# Patient Record
Sex: Female | Born: 1940 | Race: White | Hispanic: No | Marital: Married | Smoking: Never smoker
Health system: Southern US, Community
[De-identification: ages and names within clinical notes are randomized; demographics above are authoritative.]

## PROBLEM LIST (undated history)

## (undated) DIAGNOSIS — K219 Gastro-esophageal reflux disease without esophagitis: Secondary | ICD-10-CM

## (undated) DIAGNOSIS — R0602 Shortness of breath: Secondary | ICD-10-CM

## (undated) DIAGNOSIS — R011 Cardiac murmur, unspecified: Secondary | ICD-10-CM

## (undated) DIAGNOSIS — J984 Other disorders of lung: Secondary | ICD-10-CM

## (undated) DIAGNOSIS — K649 Unspecified hemorrhoids: Secondary | ICD-10-CM

## (undated) DIAGNOSIS — R58 Hemorrhage, not elsewhere classified: Secondary | ICD-10-CM

## (undated) DIAGNOSIS — I10 Essential (primary) hypertension: Secondary | ICD-10-CM

## (undated) DIAGNOSIS — K449 Diaphragmatic hernia without obstruction or gangrene: Secondary | ICD-10-CM

## (undated) DIAGNOSIS — R51 Headache: Secondary | ICD-10-CM

## (undated) DIAGNOSIS — G629 Polyneuropathy, unspecified: Secondary | ICD-10-CM

## (undated) DIAGNOSIS — M1990 Unspecified osteoarthritis, unspecified site: Secondary | ICD-10-CM

## (undated) DIAGNOSIS — E079 Disorder of thyroid, unspecified: Secondary | ICD-10-CM

## (undated) DIAGNOSIS — C801 Malignant (primary) neoplasm, unspecified: Secondary | ICD-10-CM

## (undated) DIAGNOSIS — R519 Headache, unspecified: Secondary | ICD-10-CM

## (undated) DIAGNOSIS — Z9981 Dependence on supplemental oxygen: Secondary | ICD-10-CM

## (undated) DIAGNOSIS — E039 Hypothyroidism, unspecified: Secondary | ICD-10-CM

## (undated) DIAGNOSIS — Z889 Allergy status to unspecified drugs, medicaments and biological substances status: Secondary | ICD-10-CM

## (undated) HISTORY — PX: EYE SURGERY: SHX253

## (undated) HISTORY — DX: Essential (primary) hypertension: I10

## (undated) HISTORY — DX: Other disorders of lung: J98.4

## (undated) HISTORY — PX: CHOLECYSTECTOMY: SHX55

## (undated) HISTORY — PX: APPENDECTOMY: SHX54

## (undated) HISTORY — DX: Diaphragmatic hernia without obstruction or gangrene: K44.9

## (undated) HISTORY — PX: NOSE SURGERY: SHX723

## (undated) HISTORY — DX: Gastro-esophageal reflux disease without esophagitis: K21.9

## (undated) HISTORY — PX: OTHER SURGICAL HISTORY: SHX169

## (undated) HISTORY — DX: Disorder of thyroid, unspecified: E07.9

## (undated) HISTORY — DX: Allergy status to unspecified drugs, medicaments and biological substances: Z88.9

## (undated) HISTORY — PX: ABDOMINAL HYSTERECTOMY: SHX81

---

## 1999-07-06 ENCOUNTER — Encounter: Payer: Self-pay | Admitting: Orthopedic Surgery

## 1999-07-06 ENCOUNTER — Encounter: Admission: RE | Admit: 1999-07-06 | Discharge: 1999-07-06 | Payer: Self-pay | Admitting: Orthopedic Surgery

## 2004-05-11 ENCOUNTER — Ambulatory Visit (HOSPITAL_COMMUNITY): Admission: RE | Admit: 2004-05-11 | Discharge: 2004-05-12 | Payer: Self-pay | Admitting: Orthopaedic Surgery

## 2009-02-25 ENCOUNTER — Encounter: Payer: Self-pay | Admitting: Internal Medicine

## 2009-03-26 ENCOUNTER — Encounter: Payer: Self-pay | Admitting: Internal Medicine

## 2009-07-30 ENCOUNTER — Encounter: Payer: Self-pay | Admitting: Internal Medicine

## 2009-08-28 ENCOUNTER — Encounter: Payer: Self-pay | Admitting: Internal Medicine

## 2009-08-29 ENCOUNTER — Ambulatory Visit: Payer: Self-pay | Admitting: Internal Medicine

## 2009-08-29 DIAGNOSIS — R0602 Shortness of breath: Secondary | ICD-10-CM

## 2009-09-03 ENCOUNTER — Telehealth: Payer: Self-pay | Admitting: Internal Medicine

## 2009-09-10 ENCOUNTER — Encounter: Payer: Self-pay | Admitting: Internal Medicine

## 2009-09-22 ENCOUNTER — Ambulatory Visit (HOSPITAL_COMMUNITY): Admission: RE | Admit: 2009-09-22 | Discharge: 2009-09-22 | Payer: Self-pay | Admitting: Internal Medicine

## 2009-09-22 ENCOUNTER — Ambulatory Visit: Payer: Self-pay | Admitting: Internal Medicine

## 2009-10-21 ENCOUNTER — Ambulatory Visit: Payer: Self-pay | Admitting: Internal Medicine

## 2009-11-04 ENCOUNTER — Encounter: Payer: Self-pay | Admitting: Internal Medicine

## 2009-11-25 ENCOUNTER — Ambulatory Visit: Payer: Self-pay | Admitting: Internal Medicine

## 2009-12-25 ENCOUNTER — Encounter: Payer: Self-pay | Admitting: Internal Medicine

## 2010-01-28 ENCOUNTER — Encounter: Payer: Self-pay | Admitting: Internal Medicine

## 2010-01-28 ENCOUNTER — Ambulatory Visit
Admission: RE | Admit: 2010-01-28 | Discharge: 2010-01-28 | Payer: Self-pay | Source: Home / Self Care | Attending: Internal Medicine | Admitting: Internal Medicine

## 2010-02-05 ENCOUNTER — Ambulatory Visit (HOSPITAL_COMMUNITY)
Admission: RE | Admit: 2010-02-05 | Discharge: 2010-02-05 | Payer: Self-pay | Source: Home / Self Care | Attending: Internal Medicine | Admitting: Internal Medicine

## 2010-02-05 ENCOUNTER — Ambulatory Visit: Admission: RE | Admit: 2010-02-05 | Discharge: 2010-02-05 | Payer: Self-pay | Source: Home / Self Care

## 2010-02-08 LAB — CONVERTED CEMR LAB
BUN: 14 mg/dL (ref 6–23)
CO2: 27 meq/L (ref 19–32)
Calcium: 8.8 mg/dL (ref 8.4–10.5)
Chloride: 102 meq/L (ref 96–112)
Creatinine, Ser: 1.3 mg/dL — ABNORMAL HIGH (ref 0.4–1.2)
GFR calc non Af Amer: 44.08 mL/min (ref >60)
Glucose, Bld: 79 mg/dL (ref 70–99)
Potassium: 4.7 meq/L (ref 3.5–5.1)
Pro B Natriuretic peptide (BNP): 52.5 pg/mL (ref 0.0–100.0)
Sodium: 136 meq/L (ref 135–145)

## 2010-02-10 NOTE — Miscellaneous (Signed)
  Clinical Lists Changes  Observations: Added new observation of SOCIAL HX: Tobacco Use - No.  Alcohol Use - no  (08/28/2009 10:43) Added new observation of ALCOHOL COMM: no (08/28/2009 10:43) Added new observation of SMOK STATUS: never (08/28/2009 10:43) Added new observation of FAMILY HX: cancer DM (08/28/2009 10:43) Added new observation of PAST SURG HX: Cholecystectomy Appendectomy Hypertension Dyspnea (08/28/2009 10:43) Added new observation of ECHOINTERP: Technically difficult study. Normal LV systolic function . Borderline LVH Mild mitral regurgitation . Mild pulmonary hypertension. (03/26/2009 10:45)      Echocardiogram  Procedure date:  03/26/2009  Findings:      Technically difficult study. Normal LV systolic function . Borderline LVH Mild mitral regurgitation . Mild pulmonary hypertension.   Past History:  Past Surgical History: Cholecystectomy Appendectomy Hypertension Dyspnea   Family History: cancer DM  Social History: Tobacco Use - No.  Alcohol Use - no Smoking Status:  never

## 2010-02-10 NOTE — Letter (Signed)
Summary: [facility]  [facility]   Imported By: Marylou Mccoy 09/22/2009 09:39:18  _____________________________________________________________________  External Attachment:    Type:   Image     Comment:   External Document

## 2010-02-10 NOTE — Letter (Signed)
Summary: [facility]  [facility]   Imported By: Marylou Mccoy 09/22/2009 10:08:06  _____________________________________________________________________  External Attachment:    Type:   Image     Comment:   External Document

## 2010-02-10 NOTE — Assessment & Plan Note (Signed)
Summary: np6/Dyspnea/appt at 10:00/ gd   Visit Type:  New Pt Referring Provider:  Marcellus Scott Primary Provider:  Dr. Veatrice Kells  CC:  no complatins.  History of Present Illness: 70 is a year old woman with HTN, HL and allergies referred by Dr. Blenda Nicely for further evlauation of dyspnea and possible RHC.  Denies any h/o known heart disease. Has never had a cath. Had stress test several months ago which reportedly showed poor exercise tolerance but apparently otherwise normal. Echo on 03/26/2009 showed normal LV function. Borderline LVH. Reported normal diastolic filling pattern (?) Mild MR. Otherwise valves normal.   PFDs showed FVC 55% FEV1 1.47 (60%) FEV1/FVC 110%  DLCO 55%. No comment on RV. RVSP est 39. Diagnosed with asthma.   Says she has been SOB for many years. Says she can walk to end of her yard and then she is very SOB. Has to stop and take a break. No CP. When she goes to [facility] can walk a few aisles before she has to stop and rest. Feels it is chronic; no change. No orthopnea, PND. Feels her feet and ankles swell at times. Never a smoker. Previous worked at [employer] in her 26s. Just a few years. 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   Husband denies her snoring. Has a lot of headaches. Had sleep study which was ok. Doesn't exercise at all. Occasional wheezing. Frequent cough. No rashes. + arthitis.   I walked her in the office. Was able to walk quickly sats stayed in 95-97% Good HR response to exercise.   Problems Prior to Update: 1)  Shortness of Breath  (ICD-786.05)  Medications Prior to Update: 1)  None  Current Medications (verified): 1)  Lisinopril 40 Mg Tabs (Lisinopril) .... Take 1 Tablet Every Day 2)  Synthroid 88 Mcg Tabs (Levothyroxine Sodium) .... Take 1 Tablet Every Day 3)  Claritin-D 24 Hour 10-240 Mg Xr24h-Tab (Loratadine-Pseudoephedrine) .... Once Daily 4)  Simvastatin 40 Mg Tabs (Simvastatin) .... Take One Tablet By Mouth Daily At  Bedtime 5)  Aspirin 81 Mg Tbec (Aspirin) .... Take One Tablet By Mouth Daily 6)  Theratrum Complete  Tabs (Multiple Vitamins-Minerals) .... Once Daily 7)  Prilosec 20 Mg Cpdr (Omeprazole) .... Two Times A Day 8)  Vesicare 5 Mg Tabs (Solifenacin Succinate) .... Once Daily 9)  Propranolol Hcl 40 Mg/15ml Soln (Propranolol Hcl) .... Two Times A Day 10)  B-1 Vitamin .... Once Daily 11)  Premarin 0.3 Mg Tabs (Estrogens Conjugated) .... Once Daily 12)  Qvar 40 Mcg/act Aers (Beclomethasone Dipropionate) .... 2 Puffs Daily 13)  Allergy Shot .... Once A Year  Allergies (verified): 1)  ! Ampicillin 2)  ! Zithromax  Past History:  Past Medical History: 1) Asthma/restrictive lung disease 2) HTN 3) HL 4) Thyroid disease with goiter 5) GERD/hiatal hernia 6) Allergies  Family History: Reviewed history from 08/28/2009 and no changes required. cancer DM  Social History: Reviewed history from 08/28/2009 and no changes required. Married. Tobacco Use - No.  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   Vital Signs:  Patient profile:   70 year old female Height:      56.5 inches Weight:      186 pounds BMI:     41.11 Pulse rate:   50 / minute BP sitting:   120 / 70  (left arm)  Cuff size:   regular  Vitals Entered By: Caralee Ates CMA (August 29, 2009 10:19 AM)  Physical Exam  General:  Gen: well appearing. no resp difficulty HEENT: normal Neck: supple. no JVD. Carotids 2+ bilat; no bruits. small goiter.  No lymphadenopathy or thryomegaly appreciated. Cor: PMI nondisplaced. Regular rate & rhythm. No rubs, gallops, murmur. Lungs: clear Abdomen: soft, nontender, nondistended. No hepatosplenomegaly. No bruits or masses. Good bowel sounds. Extremities: no cyanosis, clubbing, rash, 1+ edema Neuro: alert & orientedx3, cranial nerves grossly intact. moves all 4 extremities w/o difficulty.  affect pleasant    Impression & Recommendations:  Problem # 1:  SHORTNESS OF BREATH (ICD-786.05) Likley multifactorial. Has evidence of some restriciton on her PFTs (versus poor effort) and also some mild edmea which may be related to diastolic dysfunction. I also suspect deconditioning playing a signficant role. Will plan cardiopulmonary exercise test to further evlauate. also start low-dose diuretic (lasix 20 mg on Mon and Fri with Kcl 20) to see if this helps. She will enroll in pulmonary rehab with Dr. Blenda Nicely. If symptoms persist in 4-8 weeks would plan R and L heart cath (+/- chest CT to more fully evaluate). Extensive discussion ( ~30+ minutes with patient and her husband about my thoughts on her dyspnea).   Other Orders: CPX Test at [facility] (CPX Test)  Patient Instructions: 1)  Your physician recommends that you schedule a follow-up appointment in: 4-6 WEEKS 2)  Your physician recommends that you return for lab work in:2 WEEKS IN [place] 3)  Your physician has recommended you make the following change in your medication: START FUROSEMIDE 20MG  ON MONDAY AND FRIDAY ONLY 4)  KLOR-CON M20 ON MONDAY AND FRIDAY ONLY 5)  Your physician has recommended that you have a cardiopulmonary stress test (CPX).  CPX testing is a non-invasive measurement of heart and lung function. It replaces a traditional treadmill stress test. This type of test provides a tremendous amount of information that relates not only to your present condition but also for future outcomes.  This test combines measurements of your ventilation, respiratory gas exchange in the lungs, electrocardiogram (EKG), blood pressure and physical response before, during, and following an exercise protocol. Prescriptions: KLOR-CON M20 20 MEQ CR-TABS (POTASSIUM CHLORIDE CRYS CR) one tablet every monday and friday only  #30 x 12   Entered by:   Deliah Goody, RN   Authorized by:   Dolores Patty, MD, [facility]   Signed by:    Deliah Goody, RN on 08/29/2009   Method used:   Electronically to        [facility] (retail)       [address]       [place], [place]  [address]       Ph: [phone] or [phone]       Fax: [phone]   RxID:   [id number] FUROSEMIDE 20 MG TABS (FUROSEMIDE) Take one tablet by mouth monday and friday only  #30 x 12   Entered by:   Deliah Goody, RN   Authorized by:   Dolores Patty, MD, [facility]   Signed by:   Deliah Goody, RN on 08/29/2009   Method used:   Electronically to        [facility] (retail)       [address]       [place]  [place], [place]  [address]       Ph: [phone] or [phone]       Fax: [phone]   RxID:   [id number]

## 2010-02-10 NOTE — Progress Notes (Signed)
Summary: pt has questions  Phone Note Call from Patient   Caller: Patient [phone] Reason for Call: Talk to Nurse Summary of Call: pt needs to ask question re last visit pls call [phone] Initial call taken by: Glynda Jaeger,  September 03, 2009 3:29 PM  Follow-up for Phone Call        spoke w/pt she will call dr Pricilla Handler office to arrange pulm rehab Meredith Staggers, RN  September 03, 2009 5:38 PM

## 2010-02-10 NOTE — Assessment & Plan Note (Signed)
Summary: per check out/per pt need this time/saf   Visit Type:  Follow-up Referring Provider:  Marcellus Scott Primary Provider:  Dr. Veatrice Kells  CC:  shortness of breath.  History of Present Illness: Rachel Green is a 70 year old woman (non-smoker) with HTN, HL and allergies referred by Dr. Blenda Nicely recently for further evlauation of dyspnea and possible RHC.  Denies any h/o known heart disease. Has never had a cath. Had stress test several months ago which reportedly showed poor exercise tolerance but apparently otherwise normal. Echo on 03/26/2009 showed normal LV function. Borderline LVH. Reported normal diastolic filling pattern (?) Mild MR. Otherwise valves normal.   PFDs showed FVC 55% FEV1 1.47 (60%) FEV1/FVC 110%  DLCO 55%. No comment on RV. RVSP est 39. Diagnosed with asthma.   At last visit we started her on low-dose lasix and referred for CPX.  CPX showed pVO2 13.9 (77%) with slope 42 RER 1.13  HR response blunted with peak HR 86  Ve/MVV 62%. No desats with exercise. Resting spiro FEV1 1.5 (58%) FVC 1.9 (57%) FEV1/FVC 78%. Felt to be mild to moderate defect due to restrictive lung disease, chrontropic incompetence and possibly diastolic dysfunction.  Feels that the lasix has helped her swelling and breathing. Has enrolled in pulmonary rehab and feels like she is getting stronger and breathing better. Continues with mild edema. Urine output much increased. No orthopnea or PND.   Current Medications (verified): 1)  Lisinopril 40 Mg Tabs (Lisinopril) .... Take 1 Tablet Every Day 2)  Synthroid 88 Mcg Tabs (Levothyroxine Sodium) .... Take 1 Tablet Every Day 3)  Claritin-D 24 Hour 10-240 Mg Xr24h-Tab (Loratadine-Pseudoephedrine) .... Once Daily 4)  Simvastatin 40 Mg Tabs (Simvastatin) .... Take One Tablet By Mouth Daily At Bedtime 5)  Aspirin 81 Mg Tbec (Aspirin) .... Take One Tablet By Mouth Daily 6)  Theratrum Complete  Tabs (Multiple Vitamins-Minerals) .... Once Daily 7)  Prilosec 20 Mg Cpdr  (Omeprazole) .... Two Times A Day 8)  Vesicare 5 Mg Tabs (Solifenacin Succinate) .... Once Daily 9)  Propranolol Hcl 40 Mg/68ml Soln (Propranolol Hcl) .... Two Times A Day 10)  B-1 Vitamin .... Once Daily 11)  Premarin 0.3 Mg Tabs (Estrogens Conjugated) .... Once Daily 12)  Qvar 40 Mcg/act Aers (Beclomethasone Dipropionate) .... 2 Puffs Daily 13)  Allergy Shot .... Once A Year 14)  Furosemide 20 Mg Tabs (Furosemide) .... Take One Tablet By Mouth Monday and Friday Only 15)  Klor-Con M20 20 Meq Cr-Tabs (Potassium Chloride Crys Cr) .... One Tablet Every Monday and Friday Only  Allergies (verified): 1)  ! Ampicillin 2)  ! Zithromax  Past History:  Past Medical History: Last updated: 08/29/2009 1) Asthma/restrictive lung disease 2) HTN 3) HL 4) Thyroid disease with goiter 5) GERD/hiatal hernia 6) Allergies  Review of Systems       As per HPI and past medical history; otherwise all systems negative.   Vital Signs:  Patient profile:   70 year old female Height:      56.5 inches Weight:      188 pounds BMI:     41.56 Pulse rate:   65 / minute BP sitting:   116 / 68  (left arm) Cuff size:   regular  Vitals Entered By: Hardin Negus, RMA (October 21, 2009 9:05 AM)  Physical Exam  General:  Well appearing. no resp difficulty HEENT: normal Neck: supple. no JVD. Carotids 2+ bilat; no bruits. small goiter.  No lymphadenopathy or thryomegaly appreciated. Cor: PMI nondisplaced. Regular  rate & rhythm. No rubs, gallops, murmur. Lungs: clear Abdomen: soft, nontender, nondistended. No hepatosplenomegaly. No bruits or masses. Good bowel sounds. Extremities: no cyanosis, clubbing, rash, 1+ edema Neuro: alert & orientedx3, cranial nerves grossly intact. moves all 4 extremities w/o difficulty. affect pleasant    Impression & Recommendations:  Problem # 1:  SHORTNESS OF BREATH (ICD-786.05) Appears to be multifactorial though is improved with diuresis and pulmonary rehab. Will  increase lasix to 20 mg once daily. Continue pulmonary rehab. Given chronotropic incompetence have asked her to cut her propoanolol (she takes for tremors) in half (1/2 tab two times a day) and see if this helps. Will see her back in 4-6 weeks and see how she is doing. Check BMET in 2 weeks to make sure potassium/renal function are stable with lasix.   Patient Instructions: 1)  Your physician has recommended you make the following change in your medication: Take Furosemide and Potassium every day and decrease Propanolol to 20mg  daily 2)  Your physician recommends that you return for lab work in: 2 weeks (bmet 786.05) 3)  Follow up in 4-6 weeks Prescriptions: KLOR-CON M20 20 MEQ CR-TABS (POTASSIUM CHLORIDE CRYS CR) one tablet once daily  #90 x 3   Entered by:   Meredith Staggers, RN   Authorized by:   Dolores Patty, MD, [facility]   Signed by:   Meredith Staggers, RN on 10/21/2009   Method used:   Electronically to        [facility] (retail)       [address]       [place], [place]  [address]       Ph: [phone] or [phone]       Fax: [phone]   RxID:   [id number] FUROSEMIDE 20 MG TABS (FUROSEMIDE) Take one tablet by once daily  #90 x 3   Entered by:   Meredith Staggers, RN   Authorized by:   Dolores Patty, MD, [facility]   Signed by:   Meredith Staggers, RN on 10/21/2009   Method used:   Electronically to        [facility] (retail)       [address]       [place], [place]  [address]       Ph: [phone] or [phone]       Fax: [phone]   RxID:   [id number] PROPRANOLOL HCL 20 MG TABS (PROPRANOLOL HCL) Take 1 tablet by mouth once a day  #90 x 3   Entered by:   Meredith Staggers, RN   Authorized by:   Dolores Patty, MD, [facility]   Signed by:   Meredith Staggers, RN on 10/21/2009   Method used:   Electronically to        [facility] (retail)       [address]       [place], [place]  [address]       Ph: [phone] or [phone]       Fax: [phone]   RxID:   [id number] KLOR-CON M20 20 MEQ CR-TABS (POTASSIUM CHLORIDE CRYS CR) one tablet once daily  #30 x 6   Entered by:   Meredith Staggers, RN   Authorized by:   Dolores Patty, MD, [facility]   Signed by:  Meredith Staggers, RN on 10/21/2009   Method used:   Electronically to        [facility] (retail)       [address]       [place], [place]  [address]       Ph: [phone] or [phone]       Fax: [phone]   RxID:   [id number] FUROSEMIDE 20 MG TABS (FUROSEMIDE) Take one tablet by once daily  #30 x 6   Entered by:   Meredith Staggers, RN   Authorized by:   Dolores Patty, MD, [facility]   Signed by:   Meredith Staggers, RN on 10/21/2009   Method used:   Electronically to        [facility] (retail)       [address]       [place], [place]  [address]       Ph: [phone] or [phone]       Fax: [phone]   RxID:   [id number]

## 2010-02-10 NOTE — Letter (Signed)
Summary: [facility]  [facility]   Imported By: Marylou Mccoy 09/22/2009 09:32:59  _____________________________________________________________________  External Attachment:    Type:   Image     Comment:   External Document

## 2010-02-10 NOTE — Assessment & Plan Note (Signed)
Summary: PER CHECK OUT/SF   Visit Type:  Follow-up Referring Provider:  Marcellus Scott Primary Provider:  Dr. Veatrice Kells  CC:  hiatal hernia - no cardiac .  History of Present Illness: Rachel Green is a 70 year old woman (non-smoker) with HTN, HL and allergies referred by Dr. Blenda Nicely recently for further evlauation of dyspnea and possible RHC.  Denies any h/o known heart disease. Has never had a cath. Had stress test several months ago which reportedly showed poor exercise tolerance but apparently otherwise normal. Echo on 03/26/2009 showed normal LV function. Borderline LVH. Reported normal diastolic filling pattern (?) Mild MR. Otherwise valves normal.   PFDs showed FVC 55% FEV1 1.47 (60%) FEV1/FVC 110%  DLCO 55%. No comment on RV. RVSP est 39. Diagnosed with asthma.    CPX showed pVO2 13.9 (77%) with slope 42 RER 1.13  HR response blunted with peak HR 86  Ve/MVV 62%. No desats with exercise. Resting spiro FEV1 1.5 (58%) FVC 1.9 (57%) FEV1/FVC 78%. Felt to be mild to moderate defect due to restrictive lung disease, chrontropic incompetence and possibly diastolic dysfunction.  At last visit lasix increased to daily and she was referred to pulmonary rehab. Beginning to feel much stronger. Breathing better. No CP. No edema ("I can see my ankle bone now."). No orthopnea or PND. Labs from 10/15 showed normal renal function and normal potassium after increasing lasix.  Pulm [facility] classes will run out after one more visit and renew in Jan 2012  Current Medications (verified): 1)  Lisinopril 40 Mg Tabs (Lisinopril) .... Take 1 Tablet Every Day 2)  Synthroid 88 Mcg Tabs (Levothyroxine Sodium) .... Take 1 Tablet Every Day 3)  Claritin-D 24 Hour 10-240 Mg Xr24h-Tab (Loratadine-Pseudoephedrine) .... Once Daily 4)  Simvastatin 40 Mg Tabs (Simvastatin) .... Take One Tablet By Mouth Daily At Bedtime 5)  Aspirin 81 Mg Tbec (Aspirin) .... Take One Tablet By Mouth Daily 6)  Theratrum Complete  Tabs (Multiple  Vitamins-Minerals) .... Once Daily 7)  Prilosec 20 Mg Cpdr (Omeprazole) .... Two Times A Day 8)  Vesicare 5 Mg Tabs (Solifenacin Succinate) .... Once Daily 9)  Propranolol Hcl 20 Mg Tabs (Propranolol Hcl) .... Take 1 Tablet By Mouth Once A Day 10)  B-1 Vitamin .... Once Daily 11)  Premarin 0.3 Mg Tabs (Estrogens Conjugated) .... Once Daily 12)  Symbicort 80-4.5 Mcg/act Aero (Budesonide-Formoterol Fumarate) .... Two Times A Day 13)  Allergy Shot .... Once A Week 14)  Furosemide 20 Mg Tabs (Furosemide) .... Take One Tablet By Once Daily 15)  Klor-Con M20 20 Meq Cr-Tabs (Potassium Chloride Crys Cr) .... One Tablet Once Daily  Allergies (verified): 1)  ! Ampicillin 2)  ! Zithromax  Review of Systems       As per HPI and past medical history; otherwise all systems negative.   Vital Signs:  Patient profile:   70 year old female Height:      56.5 inches Weight:      187 pounds BMI:     41.33 Pulse rate:   76 / minute Pulse (ortho):   57 / minute BP sitting:   88 / 60  (left arm) BP standing:   114 / 66 Cuff size:   regular  Vitals Entered By: Hardin Negus, RMA (November 25, 2009 9:18 AM)  Serial Vital Signs/Assessments:  Time      Position  BP       Pulse  Resp  Temp     By  Lying RA  112/64   51                    Hardin Negus, [place]           Sitting   109/65   [address] [place]           Standing  114/66   57                    Hardin Negus, [place]  Comments: 2 mins   BP 120/67  P 55 5 mins   BP 113/64  P 55 no symptoms By: Hardin Negus, RMA    Physical Exam  General:  Well appearing. no resp difficulty HEENT: normal Neck: supple. no JVD. Carotids 2+ bilat; no bruits. small goiter.  No lymphadenopathy or thryomegaly appreciated. Cor: PMI nondisplaced. Regular rate & rhythm. No rubs, gallops, murmur. Lungs: clear Abdomen: soft, nontender, nondistended. No hepatosplenomegaly. No bruits or masses. Good bowel sounds. Extremities: no  cyanosis, clubbing, rash, 1+ edema Neuro: alert & orientedx3, cranial nerves grossly intact. moves all 4 extremities w/o difficulty. affect pleasant    Impression & Recommendations:  Problem # 1:  SHORTNESS OF BREATH (ICD-786.05) Much improved with pulmonary rehab and diuresis. However, BP low. Will check orthostatics, may have to cut lasix back. Stressed to her the need to continue her exercise program at home when pulmonary rehab runs out. Check labs today. As long as symptoms improving will not pursue cath at this time, if gets worse will reconsider. Will likely need f/u exercise testing early next year to reassess functional capacity.  Other Orders: EKG w/ Interpretation (93000) TLB-BMP (Basic Metabolic Panel-BMET) (80048-METABOL) TLB-BNP (B-Natriuretic Peptide) (83880-BNPR)  Patient Instructions: 1)  Follow up in 2 months. 2)  Labs today

## 2010-02-12 NOTE — Assessment & Plan Note (Signed)
Summary: 2 month rov/sl   Visit Type:  2 months follow up Referring Provider:  Marcellus Scott Primary Provider:  Dr. Veatrice Kells  CC:  Heart flutter.  History of Present Illness: Rachel Green is a 70 year old woman (non-smoker) with HTN, HL and allergies referred by Dr. Blenda Nicely recently for further evaluation of dyspnea.  Denies any h/o known heart disease. Has never had a cath. Had stress test several months ago which reportedly showed poor exercise tolerance but apparently otherwise normal. Echo on 03/26/2009 showed normal LV function. Borderline LVH. Reported normal diastolic filling pattern (?) Mild MR. Otherwise valves normal.   PFDs showed FVC 55% FEV1 1.47 (60%) FEV1/FVC 110%  DLCO 55%. No comment on RV. RVSP est 39. Diagnosed with asthma.   CPX showed pVO2 13.9 (77%) with slope 42 RER 1.13  HR response blunted with peak HR 86  Ve/MVV 62%. No desats with exercise. Resting spiro FEV1 1.5 (58%) FVC 1.9 (57%) FEV1/FVC 78%. Felt to be mild to moderate defect due to restrictive lung disease, chrontropic incompetence and possibly diastolic dysfunction.  Finished pulmonary rehab in November. Continues to feel OK. Makes an effort to walk around her house as much as possible. Occasionally goes out for 15-20 min walk. Get SOB with walking up stpes or hills. Able to do all ADLs without dyspnea (which is refreshing for her). Edema well managed with lasix. Recent BNP 53.   Current Medications (verified): 1)  Lisinopril 40 Mg Tabs (Lisinopril) .... Take 1 Tablet Every Day 2)  Synthroid 88 Mcg Tabs (Levothyroxine Sodium) .... Take 1 Tablet Every Day 3)  Claritin-D 24 Hour 10-240 Mg Xr24h-Tab (Loratadine-Pseudoephedrine) .... Once Daily 4)  Simvastatin 40 Mg Tabs (Simvastatin) .... Take One Tablet By Mouth Daily At Bedtime 5)  Aspirin 81 Mg Tbec (Aspirin) .... Take One Tablet By Mouth Daily 6)  Theratrum Complete  Tabs (Multiple Vitamins-Minerals) .... Once Daily 7)  Prilosec 20 Mg Cpdr (Omeprazole) .... Two  Times A Day 8)  Vesicare 5 Mg Tabs (Solifenacin Succinate) .... Once Daily 9)  Propranolol Hcl 20 Mg Tabs (Propranolol Hcl) .... Take 1 Tablet By Mouth Once A Day 10)  B-1 Vitamin .... Once Daily 11)  Premarin 0.3 Mg Tabs (Estrogens Conjugated) .... Once Daily 12)  Symbicort 80-4.5 Mcg/act Aero (Budesonide-Formoterol Fumarate) .... Two Times A Day 13)  Allergy Shot .... Once A Week 14)  Furosemide 20 Mg Tabs (Furosemide) .... Take One Tablet By Once Daily 15)  Klor-Con M20 20 Meq Cr-Tabs (Potassium Chloride Crys Cr) .... One Tablet Once Daily  Allergies: 1)  ! Ampicillin 2)  ! Zithromax  Review of Systems       As per HPI and past medical history; otherwise all systems negative.   Vital Signs:  Patient profile:   70 year old female Height:      56.5 inches Weight:      183 pounds BMI:     40.45 Pulse rate:   61 / minute Pulse rhythm:   regular Resp:     18 per minute BP sitting:   116 / 62  (left arm)  Vitals Entered By: Vikki Ports (January 28, 2010 11:35 AM)  Physical Exam  General:  Well appearing. no resp difficulty HEENT: normal Neck: supple. no JVD. Carotids 2+ bilat; no bruits. small goiter.  No lymphadenopathy or thryomegaly appreciated. Cor: PMI nondisplaced. Regular rate & rhythm. No rubs, gallops, murmur. Lungs: clear Abdomen: soft, nontender, nondistended. No hepatosplenomegaly. No bruits or masses. Good bowel sounds. Extremities:  no cyanosis, clubbing, rash, 1+ edema Neuro: alert & orientedx3, cranial nerves grossly intact. moves all 4 extremities w/o difficulty. affect pleasant    Impression & Recommendations:  Problem # 1:  SHORTNESS OF BREATH (ICD-786.05) Doing well. Overall, I fell dyspnea is multifactorial with a large component of deconditing + asthma/bronchitis and mild diastolic dysfunction.  I encouraged her to remaan active to sustain the benefits she acheived with pulmonary rehab. Will also continue lasix. Will get f/u echo now. See me back in  6 months - sooner if dyspnea progressing.   Other Orders: Echocardiogram (Echo)  Patient Instructions: 1)  Your physician recommends that you schedule a follow-up appointment in: 6 months 2)  Your physician recommends that you continue on your current medications as directed. Please refer to the Current Medication list given to you today. 3)  Your physician has requested that you have an echocardiogram.  Echocardiography is a painless test that uses sound waves to create images of your heart. It provides your doctor with information about the size and shape of your heart and how well your heart's chambers and valves are working.  This procedure takes approximately one hour. There are no restrictions for this procedure.

## 2010-02-18 ENCOUNTER — Telehealth: Payer: Self-pay | Admitting: Internal Medicine

## 2010-02-26 NOTE — Progress Notes (Signed)
Summary: pt rtn call***pt rtn call 2nd time  Phone Note Call from Patient   Caller: Patient [phone] Reason for Call: Talk to Nurse Summary of Call: pt rtn call Initial call taken by: Glynda Jaeger,  February 18, 2010 10:23 AM  Follow-up for Phone Call        Left message to call back Meredith Staggers, RN  February 18, 2010 4:48 PM   Additional Follow-up for Phone Call Additional follow up Details #1::        pt rtn your call  Omer Jack  February 19, 2010 8:16 AM   pt given results Meredith Staggers, RN  February 19, 2010 4:27 PM

## 2010-05-29 NOTE — Op Note (Signed)
NAMEFELISA, Rachel Green NO.:  [id number]   MEDICAL RECORD NO.:  [id number]          PATIENT TYPE:  OIB   LOCATION:  2899                         FACILITY:  [facility]   PHYSICIAN:  Mark C. Ophelia Charter, M.D.    DATE OF BIRTH:  1940/07/09   DATE OF PROCEDURE:  05/11/2004  DATE OF DISCHARGE:                                 OPERATIVE REPORT   PREOPERATIVE DIAGNOSIS:  L4-5 stenosis.   POSTOPERATIVE DIAGNOSIS:  L4-5 stenosis.   PROCEDURE:  L4-5 decompression.   SURGEON:  Mark C. Ophelia Charter, M.D.   ASSISTANT:  Cleone Slim, R.N.F.A.   ANESTHESIA:  GOT.   ESTIMATED BLOOD LOSS:  100 mL.   PROCEDURE:  After induction of general anesthesia, the patient was placed on  the Andrews frame with standard prepping and draping, preoperative Ancef  prophylaxis.  The area was squared with towels, Betadine Vi-Drape applied.  Laminectomy sheet.  Spinal needle at L4-5.  Crosstable lateral x-ray  confirmed that the needle was exactly at L4-5.  Incision was made in the  midline.  Subperiosteal dissection on the spinous process was performed.  The posterior elements were removed, removing the spinous process, a portion  of L5, and 80-90% of the spinous process of L4.  Laminectomy was performed,  complete, and with some mild tightness proximally though the remaining  portion of the spinous process was removed.  Thick chunks of ligaments were  removed, facet overhanging spurs were removed, and foraminotomies were  performed on the right and left side.  The operating microscope was  straightened and brought in and the remaining chunks of ligament were taken  out laterally, tilting the scope and switching operative sides with the  assistant.  Nerve roots were visualized.  Foramina showed that there were  some spurs overhanging there in the foramina, but it was primarily due to  thick chunks of ligament.  The disk was firm, did show some bulging in both  foramina, but with the posterior decompression it  was elected not to perform  a microdiskectomy.  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  The gutters were checked again and sweeps were made, lifting  the [place] around backwards and feeling the lateral wall, making sure all  bone had been removed out to the level of the pedicle.  The operative field  was dry.  The draped microscope was removed.  Deep fascia was closed with 0  Vicryl, 2-0 Vicryl for the subcutaneous tissue, skin staple closure,  Marcaine infiltration on the skin.  The patient was transferred to the  recovery room neurologically intact.  Instrument count and needle count was  correct.    MCY/MEDQ  D:  05/11/2004  T:  05/11/2004  Job:  [id number]

## 2010-07-30 ENCOUNTER — Other Ambulatory Visit: Payer: Self-pay | Admitting: *Deleted

## 2010-07-30 MED ORDER — PROPRANOLOL HCL 20 MG PO TABS: 20.0000 mg | ORAL_TABLET | Freq: Every day | ORAL | Status: DC

## 2010-08-06 ENCOUNTER — Encounter: Payer: Self-pay | Admitting: Internal Medicine

## 2010-08-10 ENCOUNTER — Ambulatory Visit: Payer: Self-pay | Admitting: Internal Medicine

## 2010-10-12 HISTORY — PX: KNEE ARTHROSCOPY: SUR90

## 2010-10-26 ENCOUNTER — Other Ambulatory Visit: Payer: Self-pay

## 2010-10-26 MED ORDER — FUROSEMIDE 20 MG PO TABS: 20.0000 mg | ORAL_TABLET | Freq: Every day | ORAL | Status: DC

## 2011-01-08 ENCOUNTER — Ambulatory Visit (INDEPENDENT_AMBULATORY_CARE_PROVIDER_SITE_OTHER): Payer: Medicare Other | Admitting: Internal Medicine

## 2011-01-08 ENCOUNTER — Encounter: Payer: Self-pay | Admitting: Internal Medicine

## 2011-01-08 VITALS — BP 122/68 | HR 54 | Ht 66.0 in | Wt 178.4 lb

## 2011-01-08 DIAGNOSIS — R0602 Shortness of breath: Secondary | ICD-10-CM

## 2011-01-08 DIAGNOSIS — R001 Bradycardia, unspecified: Secondary | ICD-10-CM | POA: Insufficient documentation

## 2011-01-08 DIAGNOSIS — I498 Other specified cardiac arrhythmias: Secondary | ICD-10-CM

## 2011-01-08 NOTE — Progress Notes (Signed)
Pulm: Dr. Blenda Nicely  HPI:  Rachel Green is a 70  year old woman (non-smoker) with HTN, HL and allergies referred by Dr. Blenda Nicely recently for further evaluation of dyspnea.  Denies any h/o known heart disease. Has never had a cath. Had stress test several months ago which reportedly showed poor exercise tolerance but apparently otherwise normal. Echo on 03/26/2009 showed normal LV function. Borderline LVH. Reported normal diastolic filling pattern (?) Mild MR. Otherwise valves normal.   PFDs showed FVC 55% FEV1 1.47 (60%) FEV1/FVC 110%  DLCO 55%. No comment on RV. RVSP est 39. Diagnosed with asthma.   CPX 2011 showed pVO2 13.9 (77%) with slope 42 RER 1.13  HR response blunted with peak HR 86  Ve/MVV 62%. No desats with exercise. Resting spiro FEV1 1.5 (58%) FVC 1.9 (57%) FEV1/FVC 78%. Felt to be mild to moderate defect due to restrictive lung disease, chrontropic incompetence and possibly diastolic dysfunction. Propanolol cut in half (needs it for tremors)  Finished pulmonary rehab in November 2011. Doing pretty well. Breathing much better. Switcher her inhaler to Qvar and felt it has helped. Echo 1/12: EF normal - grade 1 diastolic dysfx. No signiifcant valvular disease. Occasional mild ankle edema. No orthopnea, PND. Lost 10 pounds.   ROS: All systems negative except as listed in HPI, PMH and Problem List.  Past Medical History  Diagnosis Date  . Asthma   . Restrictive lung disease   . HTN (hypertension)   . Thyroid disease     with goiter  . GERD (gastroesophageal reflux disease)   . Hiatal hernia   . Multiple allergies     Current Outpatient Prescriptions  Medication Sig Dispense Refill  . aspirin 81 MG EC tablet Take 81 mg by mouth daily.        . budesonide-formoterol (SYMBICORT) 80-4.5 MCG/ACT inhaler Inhale 2 puffs into the lungs 2 (two) times daily.        . celecoxib (CELEBREX) 200 MG capsule Take 200 mg by mouth daily.        Marland Kitchen estrogens, conjugated, (PREMARIN) 0.3 MG tablet Take 0.3  mg by mouth daily. Take daily for 21 days then do not take for 7 days.       . furosemide (LASIX) 20 MG tablet Take 1 tablet (20 mg total) by mouth daily.  30 tablet  6  . levothyroxine (SYNTHROID, LEVOTHROID) 88 MCG tablet Take 55 mcg by mouth daily.       Marland Kitchen lisinopril (PRINIVIL,ZESTRIL) 40 MG tablet Take 40 mg by mouth daily.        Marland Kitchen loratadine-pseudoephedrine (CLARITIN-D 24-HOUR) 10-240 MG per 24 hr tablet Take 1 tablet by mouth daily.        . Multiple Vitamins-Minerals (THERATRUM COMPLETE) TABS Take 1 tablet by mouth daily.        . NON FORMULARY ALLERY SHOT. Once a week       . omeprazole (PRILOSEC) 20 MG capsule Take 20 mg by mouth 2 (two) times daily.        . potassium chloride SA (K-DUR,KLOR-CON) 20 MEQ tablet Take 20 mEq by mouth daily.        . propranolol (INDERAL) 20 MG tablet Take 1 tablet (20 mg total) by mouth daily.  30 tablet  6  . simvastatin (ZOCOR) 40 MG tablet Take 40 mg by mouth at bedtime.        . solifenacin (VESICARE) 5 MG tablet Take 10 mg by mouth daily.        Marland Kitchen  Thiamine HCl (VITAMIN B-1 PO) Take 1 tablet by mouth daily.           PHYSICAL EXAM: Filed Vitals:   01/08/11 0959  BP: 122/68  Pulse: 54   General:  Well appearing. No resp difficulty HEENT: normal Neck: supple. JVP flat. Carotids 2+ bilaterally; no bruits. No lymphadenopathy or thryomegaly appreciated. Cor: PMI normal. Regular rate & rhythm. No rubs, gallops or murmurs. Lungs: clear Abdomen: soft, nontender, nondistended. No hepatosplenomegaly. No bruits or masses. Good bowel sounds. Extremities: no cyanosis, clubbing, rash, 1+ L ankle edema. Ok on right Neuro: alert & orientedx3, cranial nerves grossly intact. Moves all 4 extremities w/o difficulty. Affect pleasant.    ZOX:WRUEA brady 54 No ST-T wave abnormalities.     ASSESSMENT & PLAN:

## 2011-01-08 NOTE — Patient Instructions (Signed)
We will see you back on an as needed basis.  Your physician recommends that you continue on your current medications as directed. Please refer to the Current Medication list given to you today.  

## 2011-01-08 NOTE — Assessment & Plan Note (Signed)
Much improved.  Diastolic dysfunction is mild. Continue current regimen. No further cardiac w/u at this time. Can f/u as needed.

## 2011-01-08 NOTE — Assessment & Plan Note (Signed)
Has significant chronotropic incompetence on CPX test. Propanolol cut back but still bradycardic. Can follow as needed. No indication for pacemaker at this point.

## 2011-01-22 DIAGNOSIS — J209 Acute bronchitis, unspecified: Secondary | ICD-10-CM | POA: Diagnosis not present

## 2011-01-25 DIAGNOSIS — M2240 Chondromalacia patellae, unspecified knee: Secondary | ICD-10-CM | POA: Diagnosis not present

## 2011-01-29 DIAGNOSIS — K219 Gastro-esophageal reflux disease without esophagitis: Secondary | ICD-10-CM | POA: Diagnosis not present

## 2011-01-29 DIAGNOSIS — I10 Essential (primary) hypertension: Secondary | ICD-10-CM | POA: Diagnosis not present

## 2011-01-29 DIAGNOSIS — E042 Nontoxic multinodular goiter: Secondary | ICD-10-CM | POA: Diagnosis not present

## 2011-01-29 DIAGNOSIS — Z79899 Other long term (current) drug therapy: Secondary | ICD-10-CM | POA: Diagnosis not present

## 2011-01-29 DIAGNOSIS — M1710 Unilateral primary osteoarthritis, unspecified knee: Secondary | ICD-10-CM | POA: Diagnosis not present

## 2011-01-29 DIAGNOSIS — E785 Hyperlipidemia, unspecified: Secondary | ICD-10-CM | POA: Diagnosis not present

## 2011-03-04 DIAGNOSIS — L821 Other seborrheic keratosis: Secondary | ICD-10-CM | POA: Diagnosis not present

## 2011-03-04 DIAGNOSIS — R233 Spontaneous ecchymoses: Secondary | ICD-10-CM | POA: Diagnosis not present

## 2011-03-13 DIAGNOSIS — M1710 Unilateral primary osteoarthritis, unspecified knee: Secondary | ICD-10-CM | POA: Diagnosis not present

## 2011-03-15 DIAGNOSIS — Z1231 Encounter for screening mammogram for malignant neoplasm of breast: Secondary | ICD-10-CM | POA: Diagnosis not present

## 2011-03-17 DIAGNOSIS — J45909 Unspecified asthma, uncomplicated: Secondary | ICD-10-CM | POA: Diagnosis not present

## 2011-03-17 DIAGNOSIS — I2789 Other specified pulmonary heart diseases: Secondary | ICD-10-CM | POA: Diagnosis not present

## 2011-03-17 DIAGNOSIS — J310 Chronic rhinitis: Secondary | ICD-10-CM | POA: Diagnosis not present

## 2011-03-17 DIAGNOSIS — K219 Gastro-esophageal reflux disease without esophagitis: Secondary | ICD-10-CM | POA: Diagnosis not present

## 2011-03-18 DIAGNOSIS — N760 Acute vaginitis: Secondary | ICD-10-CM | POA: Diagnosis not present

## 2011-03-18 DIAGNOSIS — R3915 Urgency of urination: Secondary | ICD-10-CM | POA: Diagnosis not present

## 2011-03-18 DIAGNOSIS — Z124 Encounter for screening for malignant neoplasm of cervix: Secondary | ICD-10-CM | POA: Diagnosis not present

## 2011-03-19 DIAGNOSIS — S81009A Unspecified open wound, unspecified knee, initial encounter: Secondary | ICD-10-CM | POA: Diagnosis not present

## 2011-03-19 DIAGNOSIS — J209 Acute bronchitis, unspecified: Secondary | ICD-10-CM | POA: Diagnosis not present

## 2011-03-19 DIAGNOSIS — Z6828 Body mass index (BMI) 28.0-28.9, adult: Secondary | ICD-10-CM | POA: Diagnosis not present

## 2011-03-19 DIAGNOSIS — J019 Acute sinusitis, unspecified: Secondary | ICD-10-CM | POA: Diagnosis not present

## 2011-03-19 DIAGNOSIS — S91009A Unspecified open wound, unspecified ankle, initial encounter: Secondary | ICD-10-CM | POA: Diagnosis not present

## 2011-03-22 DIAGNOSIS — IMO0002 Reserved for concepts with insufficient information to code with codable children: Secondary | ICD-10-CM | POA: Diagnosis not present

## 2011-03-22 DIAGNOSIS — M779 Enthesopathy, unspecified: Secondary | ICD-10-CM | POA: Diagnosis not present

## 2011-03-22 DIAGNOSIS — M7750 Other enthesopathy of unspecified foot: Secondary | ICD-10-CM | POA: Diagnosis not present

## 2011-03-25 DIAGNOSIS — M1710 Unilateral primary osteoarthritis, unspecified knee: Secondary | ICD-10-CM | POA: Diagnosis not present

## 2011-04-01 DIAGNOSIS — M1710 Unilateral primary osteoarthritis, unspecified knee: Secondary | ICD-10-CM | POA: Diagnosis not present

## 2011-04-07 DIAGNOSIS — M79609 Pain in unspecified limb: Secondary | ICD-10-CM | POA: Diagnosis not present

## 2011-04-08 DIAGNOSIS — M1710 Unilateral primary osteoarthritis, unspecified knee: Secondary | ICD-10-CM | POA: Diagnosis not present

## 2011-04-14 DIAGNOSIS — M79609 Pain in unspecified limb: Secondary | ICD-10-CM | POA: Diagnosis not present

## 2011-04-14 DIAGNOSIS — I10 Essential (primary) hypertension: Secondary | ICD-10-CM | POA: Diagnosis not present

## 2011-04-14 DIAGNOSIS — T148XXA Other injury of unspecified body region, initial encounter: Secondary | ICD-10-CM | POA: Diagnosis not present

## 2011-04-15 DIAGNOSIS — R059 Cough, unspecified: Secondary | ICD-10-CM | POA: Diagnosis not present

## 2011-04-15 DIAGNOSIS — R05 Cough: Secondary | ICD-10-CM | POA: Diagnosis not present

## 2011-04-15 DIAGNOSIS — R490 Dysphonia: Secondary | ICD-10-CM | POA: Diagnosis not present

## 2011-04-15 DIAGNOSIS — K219 Gastro-esophageal reflux disease without esophagitis: Secondary | ICD-10-CM | POA: Diagnosis not present

## 2011-04-21 DIAGNOSIS — T148 Other injury of unspecified body region: Secondary | ICD-10-CM | POA: Diagnosis not present

## 2011-05-05 DIAGNOSIS — Z6828 Body mass index (BMI) 28.0-28.9, adult: Secondary | ICD-10-CM | POA: Diagnosis not present

## 2011-05-05 DIAGNOSIS — E042 Nontoxic multinodular goiter: Secondary | ICD-10-CM | POA: Diagnosis not present

## 2011-05-05 DIAGNOSIS — J309 Allergic rhinitis, unspecified: Secondary | ICD-10-CM | POA: Diagnosis not present

## 2011-05-05 DIAGNOSIS — K219 Gastro-esophageal reflux disease without esophagitis: Secondary | ICD-10-CM | POA: Diagnosis not present

## 2011-05-05 DIAGNOSIS — Z79899 Other long term (current) drug therapy: Secondary | ICD-10-CM | POA: Diagnosis not present

## 2011-05-05 DIAGNOSIS — E785 Hyperlipidemia, unspecified: Secondary | ICD-10-CM | POA: Diagnosis not present

## 2011-05-05 DIAGNOSIS — I10 Essential (primary) hypertension: Secondary | ICD-10-CM | POA: Diagnosis not present

## 2011-05-18 DIAGNOSIS — R1310 Dysphagia, unspecified: Secondary | ICD-10-CM | POA: Diagnosis not present

## 2011-05-18 DIAGNOSIS — R490 Dysphonia: Secondary | ICD-10-CM | POA: Diagnosis not present

## 2011-05-20 DIAGNOSIS — G252 Other specified forms of tremor: Secondary | ICD-10-CM | POA: Diagnosis not present

## 2011-05-20 DIAGNOSIS — G250 Essential tremor: Secondary | ICD-10-CM | POA: Diagnosis not present

## 2011-05-20 DIAGNOSIS — G603 Idiopathic progressive neuropathy: Secondary | ICD-10-CM | POA: Diagnosis not present

## 2011-05-31 DIAGNOSIS — R609 Edema, unspecified: Secondary | ICD-10-CM | POA: Diagnosis not present

## 2011-06-01 DIAGNOSIS — K222 Esophageal obstruction: Secondary | ICD-10-CM | POA: Diagnosis not present

## 2011-06-01 DIAGNOSIS — R12 Heartburn: Secondary | ICD-10-CM | POA: Diagnosis not present

## 2011-06-01 DIAGNOSIS — K2940 Chronic atrophic gastritis without bleeding: Secondary | ICD-10-CM | POA: Diagnosis not present

## 2011-06-01 DIAGNOSIS — R1310 Dysphagia, unspecified: Secondary | ICD-10-CM | POA: Diagnosis not present

## 2011-06-01 DIAGNOSIS — K3184 Gastroparesis: Secondary | ICD-10-CM | POA: Diagnosis not present

## 2011-06-01 DIAGNOSIS — K219 Gastro-esophageal reflux disease without esophagitis: Secondary | ICD-10-CM | POA: Diagnosis not present

## 2011-06-03 DIAGNOSIS — R609 Edema, unspecified: Secondary | ICD-10-CM | POA: Diagnosis not present

## 2011-06-15 DIAGNOSIS — M722 Plantar fascial fibromatosis: Secondary | ICD-10-CM | POA: Diagnosis not present

## 2011-06-15 DIAGNOSIS — M7750 Other enthesopathy of unspecified foot: Secondary | ICD-10-CM | POA: Diagnosis not present

## 2011-06-15 DIAGNOSIS — M25476 Effusion, unspecified foot: Secondary | ICD-10-CM | POA: Diagnosis not present

## 2011-06-29 DIAGNOSIS — M1710 Unilateral primary osteoarthritis, unspecified knee: Secondary | ICD-10-CM | POA: Diagnosis not present

## 2011-07-01 DIAGNOSIS — G250 Essential tremor: Secondary | ICD-10-CM | POA: Diagnosis not present

## 2011-07-01 DIAGNOSIS — G603 Idiopathic progressive neuropathy: Secondary | ICD-10-CM | POA: Diagnosis not present

## 2011-07-01 DIAGNOSIS — G252 Other specified forms of tremor: Secondary | ICD-10-CM | POA: Diagnosis not present

## 2011-07-06 DIAGNOSIS — J209 Acute bronchitis, unspecified: Secondary | ICD-10-CM | POA: Diagnosis not present

## 2011-07-06 DIAGNOSIS — Z6830 Body mass index (BMI) 30.0-30.9, adult: Secondary | ICD-10-CM | POA: Diagnosis not present

## 2011-07-06 DIAGNOSIS — H6120 Impacted cerumen, unspecified ear: Secondary | ICD-10-CM | POA: Diagnosis not present

## 2011-07-07 DIAGNOSIS — I2789 Other specified pulmonary heart diseases: Secondary | ICD-10-CM | POA: Diagnosis not present

## 2011-07-07 DIAGNOSIS — J310 Chronic rhinitis: Secondary | ICD-10-CM | POA: Diagnosis not present

## 2011-07-07 DIAGNOSIS — K219 Gastro-esophageal reflux disease without esophagitis: Secondary | ICD-10-CM | POA: Diagnosis not present

## 2011-07-07 DIAGNOSIS — J45909 Unspecified asthma, uncomplicated: Secondary | ICD-10-CM | POA: Diagnosis not present

## 2011-07-12 DIAGNOSIS — Z6830 Body mass index (BMI) 30.0-30.9, adult: Secondary | ICD-10-CM | POA: Diagnosis not present

## 2011-07-12 DIAGNOSIS — B370 Candidal stomatitis: Secondary | ICD-10-CM | POA: Diagnosis not present

## 2011-07-21 DIAGNOSIS — L2089 Other atopic dermatitis: Secondary | ICD-10-CM | POA: Diagnosis not present

## 2011-07-21 DIAGNOSIS — R5381 Other malaise: Secondary | ICD-10-CM | POA: Diagnosis not present

## 2011-07-22 DIAGNOSIS — M1710 Unilateral primary osteoarthritis, unspecified knee: Secondary | ICD-10-CM | POA: Diagnosis not present

## 2011-07-27 DIAGNOSIS — Z6830 Body mass index (BMI) 30.0-30.9, adult: Secondary | ICD-10-CM | POA: Diagnosis not present

## 2011-07-27 DIAGNOSIS — E042 Nontoxic multinodular goiter: Secondary | ICD-10-CM | POA: Diagnosis not present

## 2011-07-27 DIAGNOSIS — B370 Candidal stomatitis: Secondary | ICD-10-CM | POA: Diagnosis not present

## 2011-07-27 DIAGNOSIS — J029 Acute pharyngitis, unspecified: Secondary | ICD-10-CM | POA: Diagnosis not present

## 2011-07-29 DIAGNOSIS — E042 Nontoxic multinodular goiter: Secondary | ICD-10-CM | POA: Diagnosis not present

## 2011-08-05 DIAGNOSIS — M1710 Unilateral primary osteoarthritis, unspecified knee: Secondary | ICD-10-CM | POA: Diagnosis not present

## 2011-08-12 DIAGNOSIS — M999 Biomechanical lesion, unspecified: Secondary | ICD-10-CM | POA: Diagnosis not present

## 2011-08-16 DIAGNOSIS — M999 Biomechanical lesion, unspecified: Secondary | ICD-10-CM | POA: Diagnosis not present

## 2011-08-17 DIAGNOSIS — K219 Gastro-esophageal reflux disease without esophagitis: Secondary | ICD-10-CM | POA: Diagnosis not present

## 2011-08-17 DIAGNOSIS — E042 Nontoxic multinodular goiter: Secondary | ICD-10-CM | POA: Diagnosis not present

## 2011-08-17 DIAGNOSIS — Z6830 Body mass index (BMI) 30.0-30.9, adult: Secondary | ICD-10-CM | POA: Diagnosis not present

## 2011-08-17 DIAGNOSIS — E785 Hyperlipidemia, unspecified: Secondary | ICD-10-CM | POA: Diagnosis not present

## 2011-08-17 DIAGNOSIS — I10 Essential (primary) hypertension: Secondary | ICD-10-CM | POA: Diagnosis not present

## 2011-08-17 DIAGNOSIS — R238 Other skin changes: Secondary | ICD-10-CM | POA: Diagnosis not present

## 2011-08-18 DIAGNOSIS — R490 Dysphonia: Secondary | ICD-10-CM | POA: Diagnosis not present

## 2011-08-18 DIAGNOSIS — R1310 Dysphagia, unspecified: Secondary | ICD-10-CM | POA: Diagnosis not present

## 2011-08-20 DIAGNOSIS — N3941 Urge incontinence: Secondary | ICD-10-CM | POA: Diagnosis not present

## 2011-08-20 DIAGNOSIS — N318 Other neuromuscular dysfunction of bladder: Secondary | ICD-10-CM | POA: Diagnosis not present

## 2011-08-20 DIAGNOSIS — R339 Retention of urine, unspecified: Secondary | ICD-10-CM | POA: Diagnosis not present

## 2011-08-20 DIAGNOSIS — R3915 Urgency of urination: Secondary | ICD-10-CM | POA: Diagnosis not present

## 2011-08-25 DIAGNOSIS — J45909 Unspecified asthma, uncomplicated: Secondary | ICD-10-CM | POA: Diagnosis not present

## 2011-08-25 DIAGNOSIS — J309 Allergic rhinitis, unspecified: Secondary | ICD-10-CM | POA: Diagnosis not present

## 2011-08-25 DIAGNOSIS — K219 Gastro-esophageal reflux disease without esophagitis: Secondary | ICD-10-CM | POA: Diagnosis not present

## 2011-08-26 DIAGNOSIS — G603 Idiopathic progressive neuropathy: Secondary | ICD-10-CM | POA: Diagnosis not present

## 2011-08-26 DIAGNOSIS — G252 Other specified forms of tremor: Secondary | ICD-10-CM | POA: Diagnosis not present

## 2011-08-26 DIAGNOSIS — G250 Essential tremor: Secondary | ICD-10-CM | POA: Diagnosis not present

## 2011-08-30 DIAGNOSIS — M1710 Unilateral primary osteoarthritis, unspecified knee: Secondary | ICD-10-CM | POA: Diagnosis not present

## 2011-08-31 DIAGNOSIS — M999 Biomechanical lesion, unspecified: Secondary | ICD-10-CM | POA: Diagnosis not present

## 2011-09-09 DIAGNOSIS — M1710 Unilateral primary osteoarthritis, unspecified knee: Secondary | ICD-10-CM | POA: Diagnosis not present

## 2011-09-16 DIAGNOSIS — S7010XA Contusion of unspecified thigh, initial encounter: Secondary | ICD-10-CM | POA: Diagnosis not present

## 2011-09-16 DIAGNOSIS — S40029A Contusion of unspecified upper arm, initial encounter: Secondary | ICD-10-CM | POA: Diagnosis not present

## 2011-09-22 DIAGNOSIS — S300XXA Contusion of lower back and pelvis, initial encounter: Secondary | ICD-10-CM | POA: Diagnosis not present

## 2011-09-22 DIAGNOSIS — Z6830 Body mass index (BMI) 30.0-30.9, adult: Secondary | ICD-10-CM | POA: Diagnosis not present

## 2011-09-22 DIAGNOSIS — Z23 Encounter for immunization: Secondary | ICD-10-CM | POA: Diagnosis not present

## 2011-09-22 DIAGNOSIS — S51809A Unspecified open wound of unspecified forearm, initial encounter: Secondary | ICD-10-CM | POA: Diagnosis not present

## 2011-09-23 DIAGNOSIS — L03119 Cellulitis of unspecified part of limb: Secondary | ICD-10-CM | POA: Diagnosis not present

## 2011-09-23 DIAGNOSIS — M1710 Unilateral primary osteoarthritis, unspecified knee: Secondary | ICD-10-CM | POA: Diagnosis not present

## 2011-09-23 DIAGNOSIS — L02419 Cutaneous abscess of limb, unspecified: Secondary | ICD-10-CM | POA: Diagnosis not present

## 2011-09-24 DIAGNOSIS — J301 Allergic rhinitis due to pollen: Secondary | ICD-10-CM | POA: Diagnosis not present

## 2011-09-24 DIAGNOSIS — J309 Allergic rhinitis, unspecified: Secondary | ICD-10-CM | POA: Diagnosis not present

## 2011-09-24 DIAGNOSIS — J45909 Unspecified asthma, uncomplicated: Secondary | ICD-10-CM | POA: Diagnosis not present

## 2011-09-24 DIAGNOSIS — R0602 Shortness of breath: Secondary | ICD-10-CM | POA: Diagnosis not present

## 2011-09-24 DIAGNOSIS — K219 Gastro-esophageal reflux disease without esophagitis: Secondary | ICD-10-CM | POA: Diagnosis not present

## 2011-09-28 ENCOUNTER — Other Ambulatory Visit (HOSPITAL_COMMUNITY): Payer: Self-pay | Admitting: Pulmonary Disease

## 2011-09-28 DIAGNOSIS — M999 Biomechanical lesion, unspecified: Secondary | ICD-10-CM | POA: Diagnosis not present

## 2011-09-28 DIAGNOSIS — R0609 Other forms of dyspnea: Secondary | ICD-10-CM

## 2011-09-29 ENCOUNTER — Ambulatory Visit (HOSPITAL_COMMUNITY): Payer: Medicare Other | Attending: Cardiology

## 2011-09-29 DIAGNOSIS — I059 Rheumatic mitral valve disease, unspecified: Secondary | ICD-10-CM | POA: Diagnosis not present

## 2011-09-29 DIAGNOSIS — R0989 Other specified symptoms and signs involving the circulatory and respiratory systems: Secondary | ICD-10-CM | POA: Diagnosis not present

## 2011-09-29 DIAGNOSIS — R609 Edema, unspecified: Secondary | ICD-10-CM | POA: Insufficient documentation

## 2011-09-29 DIAGNOSIS — I379 Nonrheumatic pulmonary valve disorder, unspecified: Secondary | ICD-10-CM | POA: Diagnosis not present

## 2011-09-29 DIAGNOSIS — R0609 Other forms of dyspnea: Secondary | ICD-10-CM | POA: Insufficient documentation

## 2011-09-29 DIAGNOSIS — I369 Nonrheumatic tricuspid valve disorder, unspecified: Secondary | ICD-10-CM | POA: Insufficient documentation

## 2011-09-29 NOTE — Progress Notes (Signed)
Echocardiogram performed.  

## 2011-09-30 DIAGNOSIS — R0602 Shortness of breath: Secondary | ICD-10-CM | POA: Diagnosis not present

## 2011-09-30 DIAGNOSIS — M999 Biomechanical lesion, unspecified: Secondary | ICD-10-CM | POA: Diagnosis not present

## 2011-10-04 ENCOUNTER — Encounter (HOSPITAL_COMMUNITY): Payer: Self-pay | Admitting: Pulmonary Disease

## 2011-10-04 DIAGNOSIS — M999 Biomechanical lesion, unspecified: Secondary | ICD-10-CM | POA: Diagnosis not present

## 2011-10-06 DIAGNOSIS — M999 Biomechanical lesion, unspecified: Secondary | ICD-10-CM | POA: Diagnosis not present

## 2011-10-07 DIAGNOSIS — L02419 Cutaneous abscess of limb, unspecified: Secondary | ICD-10-CM | POA: Diagnosis not present

## 2011-10-07 DIAGNOSIS — L03119 Cellulitis of unspecified part of limb: Secondary | ICD-10-CM | POA: Diagnosis not present

## 2011-10-07 DIAGNOSIS — M999 Biomechanical lesion, unspecified: Secondary | ICD-10-CM | POA: Diagnosis not present

## 2011-10-08 ENCOUNTER — Encounter (HOSPITAL_COMMUNITY): Payer: Self-pay | Admitting: Respiratory Therapy

## 2011-10-11 ENCOUNTER — Other Ambulatory Visit: Payer: Self-pay | Admitting: Physician Assistant

## 2011-10-11 ENCOUNTER — Other Ambulatory Visit (HOSPITAL_COMMUNITY): Payer: Medicare Other

## 2011-10-11 DIAGNOSIS — M999 Biomechanical lesion, unspecified: Secondary | ICD-10-CM | POA: Diagnosis not present

## 2011-10-12 ENCOUNTER — Encounter (HOSPITAL_COMMUNITY)
Admission: RE | Admit: 2011-10-12 | Discharge: 2011-10-12 | Disposition: A | Discharge status: Home / Self Care | Payer: Medicare Other | Source: Ambulatory Visit | Attending: Orthopedic Surgery | Admitting: Orthopedic Surgery

## 2011-10-12 ENCOUNTER — Encounter (HOSPITAL_COMMUNITY): Payer: Self-pay

## 2011-10-12 ENCOUNTER — Other Ambulatory Visit: Payer: Medicare Other

## 2011-10-12 HISTORY — DX: Hemorrhage, not elsewhere classified: R58

## 2011-10-12 HISTORY — DX: Headache: R51

## 2011-10-12 HISTORY — DX: Unspecified hemorrhoids: K64.9

## 2011-10-12 HISTORY — DX: Malignant (primary) neoplasm, unspecified: C80.1

## 2011-10-12 HISTORY — DX: Cardiac murmur, unspecified: R01.1

## 2011-10-12 HISTORY — DX: Dependence on supplemental oxygen: Z99.81

## 2011-10-12 HISTORY — DX: Unspecified osteoarthritis, unspecified site: M19.90

## 2011-10-12 HISTORY — DX: Polyneuropathy, unspecified: G62.9

## 2011-10-12 HISTORY — DX: Hypothyroidism, unspecified: E03.9

## 2011-10-12 HISTORY — DX: Shortness of breath: R06.02

## 2011-10-12 HISTORY — DX: Headache, unspecified: R51.9

## 2011-10-12 LAB — CBC WITH DIFFERENTIAL/PLATELET
Basophils Absolute: 0 10*3/uL (ref 0.0–0.1)
Basophils Relative: 0 % (ref 0–1)
Eosinophils Absolute: 0.1 10*3/uL (ref 0.0–0.7)
Hemoglobin: 12.5 g/dL (ref 12.0–15.0)
MCH: 32 pg (ref 26.0–34.0)
MCHC: 32.9 g/dL (ref 30.0–36.0)
Monocytes Relative: 10 % (ref 3–12)
Neutro Abs: 7 10*3/uL (ref 1.7–7.7)
Neutrophils Relative %: 59 % (ref 43–77)
Platelets: 183 10*3/uL (ref 150–400)

## 2011-10-12 LAB — COMPREHENSIVE METABOLIC PANEL
BUN: 17 mg/dL (ref 6–23)
Calcium: 10.5 mg/dL (ref 8.4–10.5)
Chloride: 104 mEq/L (ref 96–112)
Creatinine, Ser: 1.23 mg/dL — ABNORMAL HIGH (ref 0.50–1.10)
GFR calc Af Amer: 51 mL/min — ABNORMAL LOW (ref >90)
GFR calc non Af Amer: 44 mL/min — ABNORMAL LOW (ref >90)
Glucose, Bld: 95 mg/dL (ref 70–99)
Potassium: 4.7 mEq/L (ref 3.5–5.1)
Sodium: 143 mEq/L (ref 135–145)

## 2011-10-12 LAB — SURGICAL PCR SCREEN
MRSA, PCR: NEGATIVE
Staphylococcus aureus: NEGATIVE

## 2011-10-12 LAB — APTT: aPTT: 27 seconds (ref 24–37)

## 2011-10-12 LAB — URINE MICROSCOPIC-ADD ON

## 2011-10-12 LAB — URINALYSIS, ROUTINE W REFLEX MICROSCOPIC
Glucose, UA: NEGATIVE mg/dL
Ketones, ur: NEGATIVE mg/dL
Specific Gravity, Urine: 1.007 (ref 1.005–1.030)
pH: 7 (ref 5.0–8.0)

## 2011-10-12 LAB — PROTIME-INR: INR: 0.96 (ref 0.00–1.49)

## 2011-10-12 NOTE — Progress Notes (Signed)
Cardio;ogist is Dr Chase Picket.  Pt is seen at [facility]monary saw, Ephriam Jenkins PA.  Medical MD is Dr Brayton Caves in [place], [place], Neurologist is Dr Adella Hare.  I sent request to [facility] for chest x-ray: [facility] for Pulmn study and notes: Dr Tomasa Blase for office notes.

## 2011-10-13 DIAGNOSIS — M999 Biomechanical lesion, unspecified: Secondary | ICD-10-CM | POA: Diagnosis not present

## 2011-10-13 LAB — URINE CULTURE: Colony Count: NO GROWTH

## 2011-10-14 DIAGNOSIS — M999 Biomechanical lesion, unspecified: Secondary | ICD-10-CM | POA: Diagnosis not present

## 2011-10-14 MED ORDER — CLINDAMYCIN PHOSPHATE 900 MG/50ML IV SOLN
900.0000 mg | INTRAVENOUS | Status: AC
  Administered 2011-10-15: 900 mg via INTRAVENOUS
  Filled 2011-10-14: qty 50

## 2011-10-14 NOTE — Progress Notes (Signed)
Copies of office visit notes and Pulmonary function test received from Dr. Tomasa Blase' office.

## 2011-10-14 NOTE — Progress Notes (Addendum)
Faxed request to Dr. Asa Saunas) and Godfrey Pick, PA(#[phone], [phone]) for copies of office visit notes and results of pulmonary function test.

## 2011-10-14 NOTE — Progress Notes (Signed)
CXR done on 11/03/2010 received from [facility].

## 2011-10-14 NOTE — H&P (Signed)
[facility] [address]   [address] , [place] [address] [phone] A Division of [facility]  Daniel F. Murphy, M.D.   Robert A. Wainer, M.D.   W. Dan Caffrey, M.D.   Devynn Scheff P. Landau, M.D.   Anna Voytek, M.D Grace R. Ibazebo, M.D.  S. Michael Tooke, M.D.   James S. Kramer, M.D.   Rebecca S. Bassett, M.D. James M. Owens, PA-C            Kirstin A. Shepperson, PA-C Josh Oneika Simonian, PA-C Brandon Parry, OPA-C  RE: Rachel Green, Rachel Green   [id number]      DOB: 01/05/1941 PROGRESS NOTE: 10-07-11 Left knee osteoarthritis follow-up and skin check.    HPI: The patient is a 70-year-old female with a history of left knee osteoarthritis. Also with history of hyperlipidemia, hypertension, COPD, GERD, and a multinodular goiter. Also idiopathic polyneuropathy. We had been planning on left total knee arthroplasty but she had developed an abrasion on the left shin with a cellulitic reaction. Treated this with p.o. antibiotics and wet to dry dressing changes. Periodic follow-up has shown improvement in the abrasion overall. The patient denies any fevers, chills, sweats, drainage, or erythema. Continues to have the left knee pain. We have failed conservative treatment with p.o. pain medications, NSAIDs, corticosteroid injection, visco-supplementation. Knee arthroscopy showed Grade-4 chondromalacia. Left knee pain is significantly affecting her quality of life and activities of daily living. It will occasionally awaken her from sleep at night. She has had to use a walker/cane on occasion.   REVIEW OF SYSTEMS:   A 10-point review of systems obtained and positive for cataracts, glasses/contacts, ringing in the ears, problems swallowing, hoarseness, dentures, shortness of breath, bronchitis, high blood pressure, diarrhea, constipation, hemorrhoids, rheumatic fever, asthma, thyroid problems, easy bruising, ankle swelling, kidney problems, burning  urination, blood in the urine, pain with urination, dizziness, headaches, weight gain.   PAST MEDICAL HISTORY:    End stage osteoarthritis left knee, hypertension, hyperlipidemia, COPD, GERD, multinodular goiter, idiopathic polyneuropathy.   PAST SURGICAL HISTORY:    Hysterectomy. She had a nose surgery. Also 3 knee surgeries and a rotator cuff surgery.   CURRENT MEDICATION:   Omeprazole, lisinopril, simvastatin, Premarin, multivitamin, baby aspirin, propranolol HCL, Chlorcon, thymine, Q-var, levothyroxine sodium, furosemide, ProAir HFA, Nystatin, Reglan, gabapentin, triamcinolone, Norco, Claritin D, and VESIcare. Lists drug allergies to Crestor, ranitidine, ampicillin, and Zithromax.   FAMILY HISTORY:   Positive for diabetes in a brother, stroke, and cancer in her sister.   SOCIAL HISTORY:   The patient is a nonsmoker, nondrinker. She is married and lives with her husband in a one story residence.   EXAMINATION: The patient is seated in the exam room in no acute distress. Alert and oriented. Appears appropriate age. Height 5'7", weight 193 pounds. BMI calculated at 30.2. Vital signs: temperature 97.6, pulse 68, respirations 16, blood pressure 128/65. HEENT: she has upper and lower dentures. Neck is supple with good range of motion. Negative lymphadenopathy. Chest and lungs clear to auscultation bilaterally. Normal breath sounds. Normal effort. Cardiac: regular rate and rhythm. No sign of murmur. Abdomen: soft, nontender, active bowel sounds in all 4 quadrants. Rectal/breast: not indicated for surgery. Neuro: cranial nerves grossly intact. Musculoskeletal: examination of left lower extremity shows she is neurovascularly intact. She has tenderness to palpation along the medial and lateral joint line. Positive patellofemoral crepitus. Exam of the skin: she does have a well healing abrasion to the anterior left shin. One small area of punctate bleeding.   No surrounding erythema. No warmth. Dorsiflexion  and plantar flexion of left ankle intact. Negative tenderness to palpation of the calf. She has mild amount of bilateral lower extremity edema.   IMAGING: No new images obtained today. Obtained x-rays of the 2 views left knee on 07-22-11 showing a bone-on-bone medial compartment in an end stage osteoarthritic knee.   ASSESSMENT:   1.  Left knee osteoarthritis end stage. 2.  Hyperlipidemia. 3.  Hypertension.  4.  COPD. 5.  GERD. 6.  Multinodular goiter. 7.  Idiopathic polyneuropathy.   PLAN: Risks and benefits of left total knee arthroplasty were discussed again today and the patient wishes to proceed. She has a preadmission appointment scheduled on Tuesday, 10-12-11. Surgery is scheduled for Friday, 10-15-11. She does wish to go home following the surgery. She obtained preoperative clearance from her pulmonologist Dr. Chadry, and primary care physician Dr. Schultz. She was given preoperative instructions. Also discussed postoperative DVT prophylaxis with Lovenox, perioperative antibiotics. She will be given clindamycin. If she has further questions or concerns prior to surgery may contact our office.   W. Dan Caffrey, M.D.  Electronically verified by W. Dan Caffrey, M.D. [facility] (JC):tal  D 10-07-11 T 10-08-11 

## 2011-10-15 ENCOUNTER — Encounter (HOSPITAL_COMMUNITY): Payer: Self-pay | Admitting: Anesthesiology

## 2011-10-15 ENCOUNTER — Inpatient Hospital Stay (HOSPITAL_COMMUNITY)
Admission: RE | Admit: 2011-10-15 | Discharge: 2011-10-19 | DRG: 470 | Disposition: A | Discharge status: Skilled Nursing Facility | Payer: Medicare Other | Source: Ambulatory Visit | Attending: Orthopedic Surgery | Admitting: Orthopedic Surgery

## 2011-10-15 ENCOUNTER — Inpatient Hospital Stay (HOSPITAL_COMMUNITY): Payer: Medicare Other | Admitting: Anesthesiology

## 2011-10-15 ENCOUNTER — Encounter (HOSPITAL_COMMUNITY): Payer: Self-pay | Admitting: Surgery

## 2011-10-15 ENCOUNTER — Encounter (HOSPITAL_COMMUNITY)
Admission: RE | Disposition: A | Discharge status: Skilled Nursing Facility | Payer: Self-pay | Source: Ambulatory Visit | Attending: Orthopedic Surgery

## 2011-10-15 DIAGNOSIS — Z7901 Long term (current) use of anticoagulants: Secondary | ICD-10-CM

## 2011-10-15 DIAGNOSIS — G8918 Other acute postprocedural pain: Secondary | ICD-10-CM | POA: Diagnosis not present

## 2011-10-15 DIAGNOSIS — D500 Iron deficiency anemia secondary to blood loss (chronic): Secondary | ICD-10-CM | POA: Diagnosis not present

## 2011-10-15 DIAGNOSIS — R011 Cardiac murmur, unspecified: Secondary | ICD-10-CM | POA: Diagnosis not present

## 2011-10-15 DIAGNOSIS — J4489 Other specified chronic obstructive pulmonary disease: Secondary | ICD-10-CM | POA: Diagnosis present

## 2011-10-15 DIAGNOSIS — J449 Chronic obstructive pulmonary disease, unspecified: Secondary | ICD-10-CM | POA: Diagnosis present

## 2011-10-15 DIAGNOSIS — S8990XA Unspecified injury of unspecified lower leg, initial encounter: Secondary | ICD-10-CM | POA: Diagnosis not present

## 2011-10-15 DIAGNOSIS — G43909 Migraine, unspecified, not intractable, without status migrainosus: Secondary | ICD-10-CM | POA: Diagnosis not present

## 2011-10-15 DIAGNOSIS — Z471 Aftercare following joint replacement surgery: Secondary | ICD-10-CM | POA: Diagnosis not present

## 2011-10-15 DIAGNOSIS — D62 Acute posthemorrhagic anemia: Secondary | ICD-10-CM | POA: Diagnosis not present

## 2011-10-15 DIAGNOSIS — I10 Essential (primary) hypertension: Secondary | ICD-10-CM | POA: Diagnosis present

## 2011-10-15 DIAGNOSIS — G609 Hereditary and idiopathic neuropathy, unspecified: Secondary | ICD-10-CM | POA: Diagnosis present

## 2011-10-15 DIAGNOSIS — K219 Gastro-esophageal reflux disease without esophagitis: Secondary | ICD-10-CM | POA: Diagnosis present

## 2011-10-15 DIAGNOSIS — Z01812 Encounter for preprocedural laboratory examination: Secondary | ICD-10-CM | POA: Diagnosis not present

## 2011-10-15 DIAGNOSIS — Z96659 Presence of unspecified artificial knee joint: Secondary | ICD-10-CM | POA: Diagnosis not present

## 2011-10-15 DIAGNOSIS — Z833 Family history of diabetes mellitus: Secondary | ICD-10-CM

## 2011-10-15 DIAGNOSIS — Z23 Encounter for immunization: Secondary | ICD-10-CM

## 2011-10-15 DIAGNOSIS — Z823 Family history of stroke: Secondary | ICD-10-CM | POA: Diagnosis not present

## 2011-10-15 DIAGNOSIS — E042 Nontoxic multinodular goiter: Secondary | ICD-10-CM | POA: Diagnosis present

## 2011-10-15 DIAGNOSIS — K449 Diaphragmatic hernia without obstruction or gangrene: Secondary | ICD-10-CM | POA: Diagnosis not present

## 2011-10-15 DIAGNOSIS — M1710 Unilateral primary osteoarthritis, unspecified knee: Secondary | ICD-10-CM | POA: Diagnosis not present

## 2011-10-15 DIAGNOSIS — E785 Hyperlipidemia, unspecified: Secondary | ICD-10-CM | POA: Diagnosis present

## 2011-10-15 DIAGNOSIS — Z79899 Other long term (current) drug therapy: Secondary | ICD-10-CM

## 2011-10-15 DIAGNOSIS — E049 Nontoxic goiter, unspecified: Secondary | ICD-10-CM | POA: Diagnosis not present

## 2011-10-15 DIAGNOSIS — E038 Other specified hypothyroidism: Secondary | ICD-10-CM | POA: Diagnosis present

## 2011-10-15 DIAGNOSIS — J984 Other disorders of lung: Secondary | ICD-10-CM | POA: Diagnosis not present

## 2011-10-15 DIAGNOSIS — R269 Unspecified abnormalities of gait and mobility: Secondary | ICD-10-CM | POA: Diagnosis not present

## 2011-10-15 DIAGNOSIS — M129 Arthropathy, unspecified: Secondary | ICD-10-CM | POA: Diagnosis not present

## 2011-10-15 DIAGNOSIS — J45909 Unspecified asthma, uncomplicated: Secondary | ICD-10-CM | POA: Diagnosis not present

## 2011-10-15 DIAGNOSIS — E079 Disorder of thyroid, unspecified: Secondary | ICD-10-CM | POA: Diagnosis not present

## 2011-10-15 DIAGNOSIS — M25569 Pain in unspecified knee: Secondary | ICD-10-CM | POA: Diagnosis not present

## 2011-10-15 DIAGNOSIS — IMO0002 Reserved for concepts with insufficient information to code with codable children: Secondary | ICD-10-CM | POA: Diagnosis not present

## 2011-10-15 DIAGNOSIS — E039 Hypothyroidism, unspecified: Secondary | ICD-10-CM | POA: Diagnosis not present

## 2011-10-15 DIAGNOSIS — R0602 Shortness of breath: Secondary | ICD-10-CM | POA: Diagnosis not present

## 2011-10-15 HISTORY — PX: TOTAL KNEE ARTHROPLASTY: SHX125

## 2011-10-15 SURGERY — ARTHROPLASTY, KNEE, TOTAL
Anesthesia: General | Site: Knee | Laterality: Left | Wound class: Clean

## 2011-10-15 MED ORDER — ENOXAPARIN SODIUM 30 MG/0.3ML ~~LOC~~ SOLN: 30.0000 mg | Freq: Two times a day (BID) | SUBCUTANEOUS | Status: DC

## 2011-10-15 MED ORDER — PHENOL 1.4 % MT LIQD
1.0000 | OROMUCOSAL | Status: DC | PRN
  Administered 2011-10-16: 1 via OROMUCOSAL
  Filled 2011-10-15: qty 177

## 2011-10-15 MED ORDER — OMEGA-3 FATTY ACIDS 1000 MG PO CAPS: 1.0000 g | ORAL_CAPSULE | Freq: Two times a day (BID) | ORAL | Status: DC

## 2011-10-15 MED ORDER — ACETAMINOPHEN 325 MG PO TABS
650.0000 mg | ORAL_TABLET | Freq: Four times a day (QID) | ORAL | Status: DC | PRN
  Administered 2011-10-16 – 2011-10-19 (×4): 650 mg via ORAL
  Filled 2011-10-15 (×4): qty 2

## 2011-10-15 MED ORDER — LISINOPRIL 40 MG PO TABS
40.0000 mg | ORAL_TABLET | Freq: Every day | ORAL | Status: DC
  Administered 2011-10-15 – 2011-10-18 (×2): 40 mg via ORAL
  Filled 2011-10-15 (×6): qty 1

## 2011-10-15 MED ORDER — PROPOFOL 10 MG/ML IV BOLUS
INTRAVENOUS | Status: DC | PRN
  Administered 2011-10-15: 170 mg via INTRAVENOUS

## 2011-10-15 MED ORDER — OXYCODONE HCL 5 MG/5ML PO SOLN: 5.0000 mg | Freq: Once | ORAL | Status: DC | PRN

## 2011-10-15 MED ORDER — ENOXAPARIN SODIUM 30 MG/0.3ML ~~LOC~~ SOLN
30.0000 mg | Freq: Two times a day (BID) | SUBCUTANEOUS | Status: DC
  Administered 2011-10-16 – 2011-10-19 (×7): 30 mg via SUBCUTANEOUS
  Filled 2011-10-15 (×9): qty 0.3

## 2011-10-15 MED ORDER — METHOCARBAMOL 500 MG PO TABS: 500.0000 mg | ORAL_TABLET | Freq: Four times a day (QID) | ORAL | Status: DC

## 2011-10-15 MED ORDER — CHLORHEXIDINE GLUCONATE 4 % EX LIQD: 60.0000 mL | Freq: Once | CUTANEOUS | Status: DC

## 2011-10-15 MED ORDER — CALCIUM CARBONATE-VITAMIN D 500-200 MG-UNIT PO TABS
1.0000 | ORAL_TABLET | Freq: Two times a day (BID) | ORAL | Status: DC
  Administered 2011-10-15 – 2011-10-19 (×9): 1 via ORAL
  Filled 2011-10-15 (×11): qty 1

## 2011-10-15 MED ORDER — FUROSEMIDE 40 MG PO TABS
40.0000 mg | ORAL_TABLET | Freq: Every day | ORAL | Status: DC
  Administered 2011-10-15 – 2011-10-19 (×5): 40 mg via ORAL
  Filled 2011-10-15 (×5): qty 1

## 2011-10-15 MED ORDER — LACTATED RINGERS IV SOLN
INTRAVENOUS | Status: DC | PRN
  Administered 2011-10-15 (×2): via INTRAVENOUS

## 2011-10-15 MED ORDER — METOCLOPRAMIDE HCL 5 MG/ML IJ SOLN: 10.0000 mg | Freq: Once | INTRAMUSCULAR | Status: DC | PRN

## 2011-10-15 MED ORDER — ACETAMINOPHEN 650 MG RE SUPP: 650.0000 mg | Freq: Four times a day (QID) | RECTAL | Status: DC | PRN

## 2011-10-15 MED ORDER — OXYCODONE-ACETAMINOPHEN 5-325 MG PO TABS: ORAL_TABLET | ORAL | Status: DC

## 2011-10-15 MED ORDER — MENTHOL 3 MG MT LOZG: 1.0000 | LOZENGE | OROMUCOSAL | Status: DC | PRN

## 2011-10-15 MED ORDER — ACETAMINOPHEN 10 MG/ML IV SOLN
INTRAVENOUS | Status: AC
  Filled 2011-10-15: qty 100

## 2011-10-15 MED ORDER — FLEET ENEMA 7-19 GM/118ML RE ENEM: 1.0000 | ENEMA | Freq: Once | RECTAL | Status: AC | PRN

## 2011-10-15 MED ORDER — FLUTICASONE PROPIONATE HFA 44 MCG/ACT IN AERO
2.0000 | INHALATION_SPRAY | Freq: Two times a day (BID) | RESPIRATORY_TRACT | Status: DC
  Administered 2011-10-16 – 2011-10-19 (×7): 2 via RESPIRATORY_TRACT
  Filled 2011-10-15: qty 10.6

## 2011-10-15 MED ORDER — ACETAMINOPHEN 10 MG/ML IV SOLN
1000.0000 mg | Freq: Four times a day (QID) | INTRAVENOUS | Status: DC
  Administered 2011-10-15: 1000 mg via INTRAVENOUS

## 2011-10-15 MED ORDER — SIMVASTATIN 40 MG PO TABS
40.0000 mg | ORAL_TABLET | Freq: Every day | ORAL | Status: DC
  Administered 2011-10-15 – 2011-10-18 (×4): 40 mg via ORAL
  Filled 2011-10-15 (×5): qty 1

## 2011-10-15 MED ORDER — METOCLOPRAMIDE HCL 10 MG PO TABS
5.0000 mg | ORAL_TABLET | Freq: Three times a day (TID) | ORAL | Status: DC | PRN
Start: 2011-10-15 — End: 2011-10-19

## 2011-10-15 MED ORDER — DOCUSATE SODIUM 100 MG PO CAPS
100.0000 mg | ORAL_CAPSULE | Freq: Two times a day (BID) | ORAL | Status: DC
  Administered 2011-10-15 – 2011-10-19 (×9): 100 mg via ORAL
  Filled 2011-10-15 (×10): qty 1

## 2011-10-15 MED ORDER — ACETAMINOPHEN 10 MG/ML IV SOLN
1000.0000 mg | Freq: Four times a day (QID) | INTRAVENOUS | Status: AC
  Administered 2011-10-15 – 2011-10-16 (×3): 1000 mg via INTRAVENOUS
  Filled 2011-10-15 (×4): qty 100

## 2011-10-15 MED ORDER — ONDANSETRON HCL 4 MG PO TABS: 4.0000 mg | ORAL_TABLET | Freq: Four times a day (QID) | ORAL | Status: DC | PRN

## 2011-10-15 MED ORDER — SENNOSIDES-DOCUSATE SODIUM 8.6-50 MG PO TABS
1.0000 | ORAL_TABLET | Freq: Every evening | ORAL | Status: DC | PRN
  Administered 2011-10-16: 1 via ORAL
  Filled 2011-10-15: qty 1

## 2011-10-15 MED ORDER — DARIFENACIN HYDROBROMIDE ER 7.5 MG PO TB24
7.5000 mg | ORAL_TABLET | Freq: Every day | ORAL | Status: DC
  Administered 2011-10-15 – 2011-10-19 (×5): 7.5 mg via ORAL
  Filled 2011-10-15 (×6): qty 1

## 2011-10-15 MED ORDER — LORATADINE-PSEUDOEPHEDRINE ER 10-240 MG PO TB24: 1.0000 | ORAL_TABLET | Freq: Every day | ORAL | Status: DC

## 2011-10-15 MED ORDER — HYDROMORPHONE HCL PF 1 MG/ML IJ SOLN
INTRAMUSCULAR | Status: AC
  Filled 2011-10-15: qty 1

## 2011-10-15 MED ORDER — DEXAMETHASONE SODIUM PHOSPHATE 4 MG/ML IJ SOLN
INTRAMUSCULAR | Status: DC | PRN
  Administered 2011-10-15: 8 mg via INTRAVENOUS

## 2011-10-15 MED ORDER — METOCLOPRAMIDE HCL 5 MG/ML IJ SOLN
INTRAMUSCULAR | Status: DC | PRN
  Administered 2011-10-15: 10 mg via INTRAVENOUS

## 2011-10-15 MED ORDER — PSEUDOEPHEDRINE HCL 60 MG PO TABS
240.0000 mg | ORAL_TABLET | Freq: Every day | ORAL | Status: DC | PRN
  Administered 2011-10-16: 240 mg via ORAL
  Filled 2011-10-15 (×2): qty 4

## 2011-10-15 MED ORDER — PROPRANOLOL HCL 20 MG PO TABS
20.0000 mg | ORAL_TABLET | Freq: Every day | ORAL | Status: DC
  Administered 2011-10-16 – 2011-10-19 (×4): 20 mg via ORAL
  Filled 2011-10-15 (×5): qty 1

## 2011-10-15 MED ORDER — ROPIVACAINE HCL 5 MG/ML IJ SOLN
INTRAMUSCULAR | Status: DC | PRN
  Administered 2011-10-15: 25 mL

## 2011-10-15 MED ORDER — HYDROMORPHONE HCL PF 1 MG/ML IJ SOLN
1.0000 mg | INTRAMUSCULAR | Status: DC | PRN
  Administered 2011-10-15: 1 mg via INTRAVENOUS
  Filled 2011-10-15: qty 1

## 2011-10-15 MED ORDER — OXYCODONE HCL 5 MG PO TABS
5.0000 mg | ORAL_TABLET | ORAL | Status: DC | PRN
  Administered 2011-10-16 (×3): 10 mg via ORAL
  Administered 2011-10-16: 5 mg via ORAL
  Administered 2011-10-16 – 2011-10-17 (×3): 10 mg via ORAL
  Administered 2011-10-17 – 2011-10-18 (×2): 5 mg via ORAL
  Administered 2011-10-18 – 2011-10-19 (×4): 10 mg via ORAL
  Filled 2011-10-15: qty 1
  Filled 2011-10-15 (×3): qty 2
  Filled 2011-10-15: qty 1
  Filled 2011-10-15 (×3): qty 2
  Filled 2011-10-15: qty 1
  Filled 2011-10-15 (×5): qty 2

## 2011-10-15 MED ORDER — HYDROMORPHONE HCL PF 1 MG/ML IJ SOLN
0.2500 mg | INTRAMUSCULAR | Status: DC | PRN
  Administered 2011-10-15 (×2): 0.5 mg via INTRAVENOUS

## 2011-10-15 MED ORDER — METOCLOPRAMIDE HCL 5 MG/ML IJ SOLN
5.0000 mg | Freq: Three times a day (TID) | INTRAMUSCULAR | Status: DC | PRN
Start: 2011-10-15 — End: 2011-10-19

## 2011-10-15 MED ORDER — LIDOCAINE HCL 1 % IJ SOLN
INTRAMUSCULAR | Status: DC | PRN
  Administered 2011-10-15: 2 mL via INTRADERMAL

## 2011-10-15 MED ORDER — SODIUM CHLORIDE 0.9 % IV SOLN: INTRAVENOUS | Status: DC

## 2011-10-15 MED ORDER — CLINDAMYCIN PHOSPHATE 600 MG/50ML IV SOLN
600.0000 mg | Freq: Four times a day (QID) | INTRAVENOUS | Status: AC
  Administered 2011-10-15 (×2): 600 mg via INTRAVENOUS
  Filled 2011-10-15 (×2): qty 50

## 2011-10-15 MED ORDER — SODIUM CHLORIDE 0.9 % IR SOLN
Status: DC | PRN
  Administered 2011-10-15: 3000 mL

## 2011-10-15 MED ORDER — OMEGA-3-ACID ETHYL ESTERS 1 G PO CAPS
1.0000 g | ORAL_CAPSULE | Freq: Two times a day (BID) | ORAL | Status: DC
  Administered 2011-10-15 – 2011-10-19 (×9): 1 g via ORAL
  Filled 2011-10-15 (×10): qty 1

## 2011-10-15 MED ORDER — LEVOTHYROXINE SODIUM 50 MCG PO TABS
50.0000 ug | ORAL_TABLET | Freq: Every day | ORAL | Status: DC
  Administered 2011-10-16 – 2011-10-19 (×4): 50 ug via ORAL
  Filled 2011-10-15 (×5): qty 1

## 2011-10-15 MED ORDER — ONDANSETRON HCL 4 MG/2ML IJ SOLN: 4.0000 mg | Freq: Four times a day (QID) | INTRAMUSCULAR | Status: DC | PRN

## 2011-10-15 MED ORDER — INFLUENZA VIRUS VACC SPLIT PF IM SUSP
0.5000 mL | INTRAMUSCULAR | Status: AC
  Administered 2011-10-16: 0.5 mL via INTRAMUSCULAR
  Filled 2011-10-15: qty 0.5

## 2011-10-15 MED ORDER — MIDAZOLAM HCL 5 MG/5ML IJ SOLN
INTRAMUSCULAR | Status: DC | PRN
  Administered 2011-10-15 (×2): 1 mg via INTRAVENOUS

## 2011-10-15 MED ORDER — ROCURONIUM BROMIDE 100 MG/10ML IV SOLN
INTRAVENOUS | Status: DC | PRN
  Administered 2011-10-15: 40 mg via INTRAVENOUS

## 2011-10-15 MED ORDER — ESTROGENS CONJUGATED 0.3 MG PO TABS
0.3000 mg | ORAL_TABLET | Freq: Every day | ORAL | Status: DC
  Administered 2011-10-15 – 2011-10-19 (×5): 0.3 mg via ORAL
  Filled 2011-10-15 (×6): qty 1

## 2011-10-15 MED ORDER — POTASSIUM CHLORIDE CRYS ER 10 MEQ PO TBCR
10.0000 meq | EXTENDED_RELEASE_TABLET | Freq: Every day | ORAL | Status: DC
  Administered 2011-10-15 – 2011-10-19 (×5): 10 meq via ORAL
  Filled 2011-10-15 (×6): qty 1

## 2011-10-15 MED ORDER — ONDANSETRON HCL 4 MG/2ML IJ SOLN
INTRAMUSCULAR | Status: DC | PRN
  Administered 2011-10-15: 4 mg via INTRAVENOUS

## 2011-10-15 MED ORDER — LORATADINE 10 MG PO TABS
10.0000 mg | ORAL_TABLET | Freq: Every day | ORAL | Status: DC | PRN
  Administered 2011-10-16 – 2011-10-17 (×2): 10 mg via ORAL
  Filled 2011-10-15 (×2): qty 1

## 2011-10-15 MED ORDER — GABAPENTIN 300 MG PO CAPS
300.0000 mg | ORAL_CAPSULE | Freq: Two times a day (BID) | ORAL | Status: DC
  Administered 2011-10-15 – 2011-10-19 (×9): 300 mg via ORAL
  Filled 2011-10-15 (×11): qty 1

## 2011-10-15 MED ORDER — PANTOPRAZOLE SODIUM 40 MG PO TBEC
40.0000 mg | DELAYED_RELEASE_TABLET | Freq: Every day | ORAL | Status: DC
  Administered 2011-10-15 – 2011-10-19 (×5): 40 mg via ORAL
  Filled 2011-10-15 (×5): qty 1

## 2011-10-15 MED ORDER — BISACODYL 10 MG RE SUPP
10.0000 mg | Freq: Every day | RECTAL | Status: DC | PRN
  Administered 2011-10-17: 10 mg via RECTAL
  Filled 2011-10-15: qty 1

## 2011-10-15 MED ORDER — LIDOCAINE HCL (CARDIAC) 20 MG/ML IV SOLN
INTRAVENOUS | Status: DC | PRN
  Administered 2011-10-15: 50 mg via INTRAVENOUS

## 2011-10-15 MED ORDER — OXYCODONE HCL 5 MG PO TABS: 5.0000 mg | ORAL_TABLET | Freq: Once | ORAL | Status: DC | PRN

## 2011-10-15 MED ORDER — FENTANYL CITRATE 0.05 MG/ML IJ SOLN
INTRAMUSCULAR | Status: DC | PRN
  Administered 2011-10-15 (×5): 50 ug via INTRAVENOUS

## 2011-10-15 MED ORDER — DEXTROSE 5 % IV SOLN
500.0000 mg | Freq: Four times a day (QID) | INTRAVENOUS | Status: DC | PRN
  Administered 2011-10-15: 500 mg via INTRAVENOUS
  Filled 2011-10-15: qty 5

## 2011-10-15 MED ORDER — METHOCARBAMOL 500 MG PO TABS: 500.0000 mg | ORAL_TABLET | Freq: Four times a day (QID) | ORAL | Status: DC | PRN

## 2011-10-15 SURGICAL SUPPLY — 65 items
BANDAGE ELASTIC 4 VELCRO ST LF (GAUZE/BANDAGES/DRESSINGS) IMPLANT
BANDAGE ELASTIC 6 VELCRO ST LF (GAUZE/BANDAGES/DRESSINGS) IMPLANT
BANDAGE ESMARK 6X9 LF (GAUZE/BANDAGES/DRESSINGS) ×1 IMPLANT
BLADE SAGITTAL 25.0X1.19X90 (BLADE) ×2 IMPLANT
BLADE SAW SAG 90X13X1.27 (BLADE) ×2 IMPLANT
BNDG ESMARK 6X9 LF (GAUZE/BANDAGES/DRESSINGS) ×2
BOWL SMART MIX CTS (DISPOSABLE) ×2 IMPLANT
CEMENT HV SMART SET (Cement) ×4 IMPLANT
CEMENT TIBIA MBT (Knees) ×1 IMPLANT
CLOTH BEACON ORANGE TIMEOUT ST (SAFETY) ×2 IMPLANT
COVER BACK TABLE 24X17X13 BIG (DRAPES) IMPLANT
COVER SURGICAL LIGHT HANDLE (MISCELLANEOUS) ×2 IMPLANT
CUFF TOURNIQUET SINGLE 34IN LL (TOURNIQUET CUFF) ×2 IMPLANT
CUFF TOURNIQUET SINGLE 44IN (TOURNIQUET CUFF) IMPLANT
DRAPE INCISE IOBAN 66X45 STRL (DRAPES) IMPLANT
DRAPE ORTHO SPLIT 77X108 STRL (DRAPES) ×2
DRAPE SURG ORHT 6 SPLT 77X108 (DRAPES) ×2 IMPLANT
DRAPE U-SHAPE 47X51 STRL (DRAPES) ×2 IMPLANT
DRSG ADAPTIC 3X8 NADH LF (GAUZE/BANDAGES/DRESSINGS) IMPLANT
DRSG PAD ABDOMINAL 8X10 ST (GAUZE/BANDAGES/DRESSINGS) ×2 IMPLANT
DURAPREP 26ML APPLICATOR (WOUND CARE) ×2 IMPLANT
ELECT REM PT RETURN 9FT ADLT (ELECTROSURGICAL) ×2
ELECTRODE REM PT RTRN 9FT ADLT (ELECTROSURGICAL) ×1 IMPLANT
EVACUATOR 1/8 PVC DRAIN (DRAIN) ×2 IMPLANT
FACESHIELD LNG OPTICON STERILE (SAFETY) ×6 IMPLANT
FEMUR CMT SIGMA LFT SZ 3 (Orthopedic Implant) ×2 IMPLANT
FEMUR CMTD SIGMA LFT SZ 3 (Orthopedic Implant) ×1 IMPLANT
FLOSEAL 10ML (HEMOSTASIS) IMPLANT
GLOVE BIOGEL PI IND STRL 8 (GLOVE) ×4 IMPLANT
GLOVE BIOGEL PI INDICATOR 8 (GLOVE) ×4
GLOVE ORTHO TXT STRL SZ7.5 (GLOVE) ×6 IMPLANT
GLOVE SURG ORTHO 8.0 STRL STRW (GLOVE) ×6 IMPLANT
GOWN PREVENTION PLUS XLARGE (GOWN DISPOSABLE) ×4 IMPLANT
GOWN PREVENTION PLUS XXLARGE (GOWN DISPOSABLE) ×2 IMPLANT
GOWN STRL NON-REIN LRG LVL3 (GOWN DISPOSABLE) IMPLANT
HANDPIECE INTERPULSE COAX TIP (DISPOSABLE) ×1
HOOD PEEL AWAY FACE SHEILD DIS (HOOD) ×2 IMPLANT
IMMOBILIZER KNEE 22 UNIV (SOFTGOODS) IMPLANT
INSERT TIBIAL PFC SIG SZ3 10MM (Knees) ×2 IMPLANT
KIT BASIN OR (CUSTOM PROCEDURE TRAY) ×2 IMPLANT
KIT ROOM TURNOVER OR (KITS) ×2 IMPLANT
MANIFOLD NEPTUNE II (INSTRUMENTS) ×2 IMPLANT
NEEDLE 22X1 1/2 (OR ONLY) (NEEDLE) IMPLANT
NS IRRIG 1000ML POUR BTL (IV SOLUTION) ×2 IMPLANT
PACK TOTAL JOINT (CUSTOM PROCEDURE TRAY) ×2 IMPLANT
PAD ARMBOARD 7.5X6 YLW CONV (MISCELLANEOUS) ×4 IMPLANT
PAD CAST 4YDX4 CTTN HI CHSV (CAST SUPPLIES) ×1 IMPLANT
PADDING CAST COTTON 4X4 STRL (CAST SUPPLIES) ×1
PADDING CAST COTTON 6X4 STRL (CAST SUPPLIES) ×2 IMPLANT
PATELLA DOME PFC 35MM (Knees) ×2 IMPLANT
SET HNDPC FAN SPRY TIP SCT (DISPOSABLE) ×1 IMPLANT
SPONGE GAUZE 4X4 12PLY (GAUZE/BANDAGES/DRESSINGS) IMPLANT
STAPLER VISISTAT 35W (STAPLE) ×2 IMPLANT
SUCTION FRAZIER TIP 10 FR DISP (SUCTIONS) ×2 IMPLANT
SUT ETHIBOND NAB CT1 #1 30IN (SUTURE) ×4 IMPLANT
SUT VIC AB 0 CT1 27 (SUTURE) ×1
SUT VIC AB 0 CT1 27XBRD ANBCTR (SUTURE) ×1 IMPLANT
SUT VIC AB 2-0 CT1 27 (SUTURE) ×2
SUT VIC AB 2-0 CT1 TAPERPNT 27 (SUTURE) ×2 IMPLANT
SYR CONTROL 10ML LL (SYRINGE) IMPLANT
TIBIA MBT CEMENT (Knees) ×2 IMPLANT
TOWEL OR 17X24 6PK STRL BLUE (TOWEL DISPOSABLE) ×2 IMPLANT
TOWEL OR 17X26 10 PK STRL BLUE (TOWEL DISPOSABLE) ×2 IMPLANT
TRAY FOLEY CATH 14FR (SET/KITS/TRAYS/PACK) ×2 IMPLANT
WATER STERILE IRR 1000ML POUR (IV SOLUTION) ×2 IMPLANT

## 2011-10-15 NOTE — Plan of Care (Signed)
Problem: Consults Goal: Diagnosis- Total Joint Replacement Hemiarthroplasty     

## 2011-10-15 NOTE — Transfer of Care (Signed)
Immediate Anesthesia Transfer of Care Note  Patient: Rachel Green  Procedure(s) Performed: Procedure(s) (LRB) with comments: TOTAL KNEE ARTHROPLASTY (Left)  Patient Location: PACU  Anesthesia Type: General and Regional  Level of Consciousness: awake, alert , oriented and patient cooperative  Airway & Oxygen Therapy: Patient Spontanous Breathing and Patient connected to nasal cannula oxygen  Post-op Assessment: Report given to PACU RN and Post -op Vital signs reviewed and stable  Post vital signs: Reviewed and stable  Complications: No apparent anesthesia complications

## 2011-10-15 NOTE — Anesthesia Postprocedure Evaluation (Signed)
Anesthesia Post Note  Patient: Rachel Green  Procedure(s) Performed: Procedure(s) (LRB): TOTAL KNEE ARTHROPLASTY (Left)  Anesthesia type: general  Patient location: PACU  Post pain: Pain level controlled  Post assessment: Patient's Cardiovascular Status Stable  Last Vitals:  Filed Vitals:   10/15/11 1215  BP: 135/62  Pulse: 58  Temp:   Resp: 12    Post vital signs: Reviewed and stable  Level of consciousness: sedated  Complications: No apparent anesthesia complications

## 2011-10-15 NOTE — H&P (View-Only) (Signed)
[facility] [address]   [address] [place], [place] [address] [phone] A Division of [facility]  Loreta Ave, M.D.   Robert A. Thurston Hole, M.D.   Burnell Blanks, M.D.   Eulas Post, M.D.   Lunette Stands, M.D Buford Dresser, M.D.  Charlsie Quest, M.D.   Estell Harpin, M.D.   Melina Fiddler, M.D. Genene Churn. Barry Dienes, PA-C            Kirstin A. Shepperson, PA-C Josh Davenport, PA-C [place], [place]  RE: Rachel Green, [place]   [id number]      DOB: 03-26-1940 PROGRESS NOTE: 10-07-11 Left knee osteoarthritis follow-up and skin check.    HPI: The patient is a 71 year old female with a history of left knee osteoarthritis. Also with history of hyperlipidemia, hypertension, COPD, GERD, and a multinodular goiter. Also idiopathic polyneuropathy. We had been planning on left total knee arthroplasty but she had developed an abrasion on the left shin with a cellulitic reaction. Treated this with p.o. antibiotics and wet to dry dressing changes. Periodic follow-up has shown improvement in the abrasion overall. The patient denies any fevers, chills, sweats, drainage, or erythema. Continues to have the left knee pain. We have failed conservative treatment with p.o. pain medications, NSAIDs, corticosteroid injection, visco-supplementation. Knee arthroscopy showed Grade-4 chondromalacia. Left knee pain is significantly affecting her quality of life and activities of daily living. It will occasionally awaken her from sleep at night. She has had to use a walker/cane on occasion.   REVIEW OF SYSTEMS:   A 10-point review of systems obtained and positive for cataracts, glasses/contacts, ringing in the ears, problems swallowing, hoarseness, dentures, shortness of breath, bronchitis, high blood pressure, diarrhea, constipation, hemorrhoids, rheumatic fever, asthma, thyroid problems, easy bruising, ankle swelling, kidney problems, burning  urination, blood in the urine, pain with urination, dizziness, headaches, weight gain.   PAST MEDICAL HISTORY:    End stage osteoarthritis left knee, hypertension, hyperlipidemia, COPD, GERD, multinodular goiter, idiopathic polyneuropathy.   PAST SURGICAL HISTORY:    Hysterectomy. She had a nose surgery. Also 3 knee surgeries and a rotator cuff surgery.   CURRENT MEDICATION:   Omeprazole, lisinopril, simvastatin, Premarin, multivitamin, baby aspirin, propranolol HCL, Chlorcon, thymine, Q-var, levothyroxine sodium, furosemide, ProAir HFA, Nystatin, Reglan, gabapentin, triamcinolone, Norco, Claritin D, and VESIcare. Lists drug allergies to Crestor, ranitidine, ampicillin, and Zithromax.   FAMILY HISTORY:   Positive for diabetes in a brother, stroke, and cancer in her sister.   SOCIAL HISTORY:   The patient is a nonsmoker, nondrinker. She is married and lives with her husband in a one story residence.   EXAMINATION: The patient is seated in the exam room in no acute distress. Alert and oriented. Appears appropriate age. Height 5'7", weight 193 pounds. BMI calculated at 30.2. Vital signs: temperature 97.6, pulse 68, respirations 16, blood pressure 128/65. HEENT: she has upper and lower dentures. Neck is supple with good range of motion. Negative lymphadenopathy. Chest and lungs clear to auscultation bilaterally. Normal breath sounds. Normal effort. Cardiac: regular rate and rhythm. No sign of murmur. Abdomen: soft, nontender, active bowel sounds in all 4 quadrants. Rectal/breast: not indicated for surgery. Neuro: cranial nerves grossly intact. Musculoskeletal: examination of left lower extremity shows she is neurovascularly intact. She has tenderness to palpation along the medial and lateral joint line. Positive patellofemoral crepitus. Exam of the skin: she does have a well healing abrasion to the anterior left shin. One small area of punctate bleeding.  No surrounding erythema. No warmth. Dorsiflexion  and plantar flexion of left ankle intact. Negative tenderness to palpation of the calf. She has mild amount of bilateral lower extremity edema.   IMAGING: No new images obtained today. Obtained x-rays of the 2 views left knee on 07-22-11 showing a bone-on-bone medial compartment in an end stage osteoarthritic knee.   ASSESSMENT:   1.  Left knee osteoarthritis end stage. 2.  Hyperlipidemia. 3.  Hypertension.  4.  COPD. 5.  GERD. 6.  Multinodular goiter. 7.  Idiopathic polyneuropathy.   PLAN: Risks and benefits of left total knee arthroplasty were discussed again today and the patient wishes to proceed. She has a preadmission appointment scheduled on Tuesday, 10-12-11. Surgery is scheduled for Friday, 10-15-11. She does wish to go home following the surgery. She obtained preoperative clearance from her pulmonologist Dr. Aldona Lento, and primary care physician Dr. Tomasa Blase. She was given preoperative instructions. Also discussed postoperative DVT prophylaxis with Lovenox, perioperative antibiotics. She will be given clindamycin. If she has further questions or concerns prior to surgery may contact our office.   Burnell Blanks, M.D.  Electronically verified by Burnell Blanks, M.D. [facility] (JC):tal  D 10-07-11 T 10-08-11

## 2011-10-15 NOTE — Progress Notes (Signed)
UR COMPLETED  

## 2011-10-15 NOTE — Brief Op Note (Signed)
10/15/2011  10:39 AM  PATIENT:  Rachel Green  71 y.o. female  PRE-OPERATIVE DIAGNOSIS:  DJD LT KNEE  POST-OPERATIVE DIAGNOSIS:  DJD LT KNEE  PROCEDURE:  Procedure(s) (LRB) with comments: TOTAL KNEE ARTHROPLASTY (Left)  SURGEON:  Surgeon(s) and Role:    * W D Carloyn Manner., MD - Primary  PHYSICIAN ASSISTANT:   ASSISTANTS: Margart Sickles, PA-C   ANESTHESIA:   regional and general  EBL:  Total I/O In: 1300 [I.V.:1300] Out: 100 [Urine:100]  BLOOD ADMINISTERED:none  DRAINS: Hemovac left knee to self suction  LOCAL MEDICATIONS USED:  NONE  SPECIMEN:  No Specimen  DISPOSITION OF SPECIMEN:  N/A  COUNTS:  YES  TOURNIQUET:   Total Tourniquet Time Documented: Thigh (Left) - 92 minutes  DICTATION: .Other Dictation: Dictation Number   PLAN OF CARE: Admit to inpatient   PATIENT DISPOSITION:  PACU - hemodynamically stable.   Delay start of Pharmacological VTE agent (>24hrs) due to surgical blood loss or risk of bleeding: yes

## 2011-10-15 NOTE — Progress Notes (Signed)
Orthopedic Tech Progress Note Patient Details:  Rachel Green 1940/09/02 [id number] CPM applied with appropriate settings; OHF applied to bed. CPM Left Knee CPM Left Knee: On Left Knee Flexion (Degrees): 60  Left Knee Extension (Degrees): 0    [name] 10/15/2011, 11:52 AM

## 2011-10-15 NOTE — Anesthesia Procedure Notes (Addendum)
Anesthesia Regional Block:  Femoral nerve block  Pre-Anesthetic Checklist: ,, timeout performed, Correct Patient, Correct Site, Correct Laterality, Correct Procedure, Correct Position, site marked, Risks and benefits discussed,  Surgical consent,  Pre-op evaluation,  At surgeon's request and post-op pain management  Laterality: Left  Prep: chloraprep       Needles:   Needle Type: Other     Needle Length: 9cm  Needle Gauge: 21    Additional Needles:  Procedures: ultrasound guided Femoral nerve block Narrative:  Start time: 10/15/2011 7:05 AM End time: 10/15/2011 7:14 AM Injection made incrementally with aspirations every 5 mL.  Performed by: Personally  Anesthesiologist: C. Frederick MD  Additional Notes: Ultrasound guidance used to: id relevant anatomy, confirm needle position, local anesthetic spread, avoidance of vascular puncture. Picture saved. No complications. Block performed personally by Janetta Hora. Gelene Mink, MD    Femoral nerve block Procedure Name: Intubation Date/Time: 10/15/2011 7:53 AM Performed by: Sheppard Evens Pre-anesthesia Checklist: Patient identified, Emergency Drugs available, Suction available, Patient being monitored and Timeout performed Patient Re-evaluated:Patient Re-evaluated prior to inductionOxygen Delivery Method: Circle system utilized Preoxygenation: Pre-oxygenation with 100% oxygen Intubation Type: IV induction Ventilation: Mask ventilation without difficulty and Oral airway inserted - appropriate to patient size Laryngoscope Size: Mac and 3 Grade View: Grade I Tube type: Oral Tube size: 7.5 mm Number of attempts: 1 Airway Equipment and Method: Stylet Placement Confirmation: ETT inserted through vocal cords under direct vision,  positive ETCO2 and breath sounds checked- equal and bilateral Secured at: 21 cm Tube secured with: Tape Dental Injury: Teeth and Oropharynx as per pre-operative assessment

## 2011-10-15 NOTE — Op Note (Signed)
Dictated

## 2011-10-15 NOTE — Anesthesia Preprocedure Evaluation (Signed)
Anesthesia Evaluation  Patient identified by MRN, date of birth, ID band Patient awake    Reviewed: Allergy & Precautions, H&P , NPO status , Patient's Chart, lab work & pertinent test results, reviewed documented beta blocker date and time   Airway Mallampati: II TM Distance: >3 FB Neck ROM: full    Dental   Pulmonary shortness of breath and with exertion, asthma ,  breath sounds clear to auscultation        Cardiovascular hypertension, Pt. on medications and Pt. on home beta blockers + Valvular Problems/Murmurs Rhythm:regular     Neuro/Psych  Headaches,  Neuromuscular disease negative psych ROS   GI/Hepatic Neg liver ROS, hiatal hernia, GERD-  Medicated and Controlled,  Endo/Other  Hypothyroidism   Renal/GU negative Renal ROS  negative genitourinary   Musculoskeletal   Abdominal   Peds  Hematology negative hematology ROS (+)   Anesthesia Other Findings See surgeon's H&P   Reproductive/Obstetrics negative OB ROS                           Anesthesia Physical Anesthesia Plan  ASA: III  Anesthesia Plan: General   Post-op Pain Management:    Induction: Intravenous  Airway Management Planned: Oral ETT  Additional Equipment:   Intra-op Plan:   Post-operative Plan: Extubation in OR  Informed Consent: I have reviewed the patients History and Physical, chart, labs and discussed the procedure including the risks, benefits and alternatives for the proposed anesthesia with the patient or authorized representative who has indicated his/her understanding and acceptance.   Dental Advisory Given  Plan Discussed with: CRNA and Surgeon  Anesthesia Plan Comments:         Anesthesia Quick Evaluation

## 2011-10-15 NOTE — Interval H&P Note (Signed)
History and Physical Interval Note:  10/15/2011 7:33 AM  Rachel Green  has presented today for surgery, with the diagnosis of DJD LT KNEE  The various methods of treatment have been discussed with the patient and family. After consideration of risks, benefits and other options for treatment, the patient has consented to  Procedure(s) (LRB) with comments: TOTAL KNEE ARTHROPLASTY (Left) as a surgical intervention .  The patient's history has been reviewed, patient examined, no change in status, stable for surgery.  I have reviewed the patient's chart and labs.  Questions were answered to the patient's satisfaction.     Demetress Tift JR,W D

## 2011-10-16 LAB — CBC
Platelets: 153 10*3/uL (ref 150–400)
RBC: 2.89 MIL/uL — ABNORMAL LOW (ref 3.87–5.11)
RDW: 14 % (ref 11.5–15.5)
WBC: 13.9 10*3/uL — ABNORMAL HIGH (ref 4.0–10.5)

## 2011-10-16 LAB — BASIC METABOLIC PANEL
Chloride: 101 mEq/L (ref 96–112)
GFR calc Af Amer: 51 mL/min — ABNORMAL LOW (ref >90)
Potassium: 4.5 mEq/L (ref 3.5–5.1)
Sodium: 134 mEq/L — ABNORMAL LOW (ref 135–145)

## 2011-10-16 NOTE — Evaluation (Addendum)
Physical Therapy Evaluation Patient Details Name: Rachel Green MRN: [id number] DOB: [birth year] Today's Date: 10/16/2011 Time: 0930-1000 PT Time Calculation (min): 30 min  PT Assessment / Plan / Recommendation Clinical Impression  Pt is a 71 y/o female s/p L TKA.  Pt will benefit from PT to maximize functional mobility for discharge to home with spouse and HHPT.    PT Assessment  Patient needs continued PT services    Follow Up Recommendations  Home health PT;Supervision - Intermittent    Does the patient have the potential to tolerate intense rehabilitation      Barriers to Discharge None      Equipment Recommendations  None recommended by PT    Recommendations for Other Services     Frequency 7X/week    Precautions / Restrictions Precautions Precautions: Fall Required Braces or Orthoses: Knee Immobilizer - Left Restrictions Weight Bearing Restrictions: Yes LLE Weight Bearing: Weight bearing as tolerated   Pertinent Vitals/Pain 2/10 knee pain with activity.       Mobility  Bed Mobility Bed Mobility: Sit to Supine Sit to Supine: 3: Mod assist;HOB flat Details for Bed Mobility Assistance: Assist for L LE management and cues for technique. Transfers Transfers: Sit to Stand;Stand to Sit Sit to Stand: 4: Min assist;From bed;With upper extremity assist Stand to Sit: 4: Min guard;To bed Details for Transfer Assistance: Verbal cue for hand placement and position of L LE.  Cues to shift wt anteriorly seconary to pt presenting with posterior lean.  Ambulation/Gait Ambulation/Gait Assistance: 4: Min guard Ambulation Distance (Feet): 3 Feet Assistive device: Rolling walker Ambulation/Gait Assistance Details: cues for increased gait speed secondary to delayed initiation of R LE swing Gait Pattern: Step-to pattern;Decreased stride length;Decreased stance time - left;Decreased hip/knee flexion - left Gait velocity: slow Stairs: No Wheelchair Mobility Wheelchair Mobility: No     Shoulder Instructions     Exercises Total Joint Exercises Ankle Circles/Pumps: AROM;Both;10 reps   PT Diagnosis: Difficulty walking;Acute pain  PT Problem List: Decreased range of motion;Decreased mobility;Decreased strength;Decreased activity tolerance;Decreased knowledge of use of DME;Pain;Decreased knowledge of precautions PT Treatment Interventions: Gait training;Stair training;Functional mobility training;Therapeutic activities;Patient/family education;DME instruction   PT Goals Acute Rehab PT Goals PT Goal Formulation: With patient Time For Goal Achievement: 10/23/11 Potential to Achieve Goals: Good Pt will go Supine/Side to Sit: with supervision PT Goal: Supine/Side to Sit - Progress: Goal set today Pt will go Sit to Supine/Side: with supervision PT Goal: Sit to Supine/Side - Progress: Goal set today Pt will go Sit to Stand: with supervision PT Goal: Sit to Stand - Progress: Goal set today Pt will go Stand to Sit: with supervision PT Goal: Stand to Sit - Progress: Goal set today Pt will Transfer Bed to Chair/Chair to Bed: with supervision PT Transfer Goal: Bed to Chair/Chair to Bed - Progress: Goal set today Pt will Ambulate: 51 - 150 feet;with rolling walker;with supervision PT Goal: Ambulate - Progress: Goal set today Pt will Go Up / Down Stairs: 1-2 stairs;with least restrictive assistive device;with min assist PT Goal: Up/Down Stairs - Progress: Goal set today  Visit Information  Last PT Received On: 10/16/11 Assistance Needed: +1    Subjective Data  Subjective: Agree to PT eval.   Patient Stated Goal: go home.    Prior Functioning  Home Living Lives With: Spouse Available Help at Discharge: Family;Available 24 hours/day Type of Home: House Home Access: Stairs to enter [employer] of Steps: 2 Entrance Stairs-Rails: None Home Layout: One level Bathroom Shower/Tub: Tub/shower unit;Curtain  Bathroom Toilet: Handicapped height Bathroom  Accessibility: Yes How Accessible: Accessible via walker Home Adaptive Equipment: Walker - rolling Prior Function Level of Independence: Independent with assistive device(s) (Cane for balance.) Able to Take Stairs?: Yes Driving: Yes Vocation: Retired Dominant Hand: Right    Cognition  Overall Cognitive Status: Appears within functional limits for tasks assessed/performed Arousal/Alertness: Awake/alert Orientation Level: Oriented X4 / Intact Behavior During Session: WFL for tasks performed    Extremity/Trunk Assessment Right Upper Extremity Assessment RUE ROM/Strength/Tone: Within functional levels Left Upper Extremity Assessment LUE ROM/Strength/Tone: Within functional levels Right Lower Extremity Assessment RLE ROM/Strength/Tone: Within functional levels Left Lower Extremity Assessment LLE ROM/Strength/Tone: Unable to fully assess;Due to pain;Due to precautions Trunk Assessment Trunk Assessment: Normal   Balance    End of Session CPM Left Knee CPM Left Knee: Off  GP     Delia Sitar 10/16/2011, 2:04 PM  Danel Requena L. Erendida Wrenn DPT [phone]

## 2011-10-16 NOTE — Op Note (Signed)
NAME:  WILMOTH, RASNIC NO.:  [id number]  MEDICAL RECORD NO.:  [id number]  LOCATION:  5N27C                        FACILITY:  [facility]  PHYSICIAN:  Dyke Brackett, M.D.    DATE OF BIRTH:  09-22-1940  DATE OF PROCEDURE: DATE OF DISCHARGE:                              OPERATIVE REPORT   INDICATIONS:  A 71 year old, intractable left knee pain, severe flexion contracture, varus deformity thought to be amenable to hospitalization, left knee replacement.  POSTOPERATIVE DIAGNOSIS:  Severe osteoarthritis, left knee.  OPERATION:  Left total knee replacement using, Sigma cemented knee, size 3 femur, tibia 10 mm bearing, 35 mm patella, all poly.  SURGEON:  Dyke Brackett, MD  ASSISTANT:  Jaquelyn Bitter. Chabon, PA  TOURNIQUET TIME:  One hour and 30 minutes.  PROCEDURE:  After general anesthetic, femoral nerve block, straight skin incision, medial parapatellar approach to the knee made.  We did a 11 mm distal femoral cut due to flexion contracture followed by resection about 3-4 mm below the most diseased medial compartment.  We had to do a significant amount of stripping of the medial side due to significant flexion contracture and varus deformity.  We eventually settled on the extension gap of 10 mm.  Attention was next directed to the femur where we sized the femur to be a size 3.  We placed the visual cutting block, set the rotation and degree of flexion gap at 10 mm with appropriate external rotation with 2 pins followed by placing the anterior-posterior and chamfer cut guide using those initial pins as a guide.  We then checked the flexion gap at 10 mm.  The tibia was next prepared with the keel hole being cut for the tibia.  Debridement was carried out on the posterior aspect of the knee.  We released the PCL.  Resection of excess menisci, remnants as well as stripping of the posterior capsule with the osteophyte removal of the posterior and medial femoral condyle.   Tibial trial was placed.  Standard tibia was used followed by femur.  Prior to this we did do a box cut for the femur and placed the femoral trial.  We obtained full extension, cut the patella leaving about 17 mm of native patella.  We then placed patellar trial.  Drill was negligible.  Good stability was noted with patella mechanism, closed with towel clips and we did obtain full extension.  The trial components were next removed. Final components were inserted, tibia followed by femur.  We elected to use a trial bearing initially and settled on the 2 mm bearing.  Trial bearing was removed.  Before releasing the tourniquet, we did check for any excess cement, none was noted.  Tourniquet was released with no bearing in the knee.  No excessive bleeding was noted from posterior aspect of the knee.  Small bleeders were coagulated.  Hemovac drain was placed exiting superolaterally to the capsule.  Final bearing was placed.  We then we then closed the wound with interrupted #1 Ethibond, 0 and 2-0 Vicryl, and skin clips.  Light compressive sterile dressing and knee immobilizer was applied.  Taken to recovery room in stable condition.  Dyke Brackett, M.D.     WDC/MEDQ  D:  10/15/2011  T:  10/16/2011  Job:  [id number]

## 2011-10-16 NOTE — Progress Notes (Signed)
Occupational Therapy Evaluation Patient Details Name: Rachel Green MRN: [id number] DOB: 1940/05/22 Today's Date: 10/16/2011 Time: 4098-1191 OT Time Calculation (min): 40 min  OT Assessment / Plan / Recommendation Clinical Impression  71 yo s/p L TKA. KI when walking. Pt will benefit from skilled OT services to max independence with ADL and functional moiblity for ADL with nec DME and AE due to below deficits. Pt plans to D/C home with husband. Pt will need HHOT services.    OT Assessment  Patient needs continued OT Services    Follow Up Recommendations  Home health OT    Barriers to Discharge None    Equipment Recommendations  None recommended by OT    Recommendations for Other Services    Frequency  Min 3X/week    Precautions / Restrictions Precautions Precautions: Fall Required Braces or Orthoses: Knee Immobilizer - Left Restrictions Weight Bearing Restrictions: Yes LLE Weight Bearing: Weight bearing as tolerated   Pertinent Vitals/Pain 4    ADL  Grooming: Set up Upper Body Bathing: Simulated;Set up Where Assessed - Upper Body Bathing: Supported sitting Lower Body Bathing: Simulated;Maximal assistance Where Assessed - Lower Body Bathing: Supported sit to stand Upper Body Dressing: Simulated;Set up Where Assessed - Upper Body Dressing: Unsupported sitting Lower Body Dressing: Simulated;Maximal assistance Where Assessed - Lower Body Dressing: Supported sit to stand Toilet Transfer: Simulated;Moderate assistance Toilet Transfer Method: Sit to stand;Stand pivot Toilet Transfer Equipment: Bedside commode Toileting - Clothing Manipulation and Hygiene: Simulated;Moderate assistance Where Assessed - Toileting Clothing Manipulation and Hygiene: Standing Equipment Used: Gait belt;Rolling walker;Reacher;Sock aid;Long-handled sponge Transfers/Ambulation Related to ADLs: Mod A.  ADL Comments: will benefit from AE    OT Diagnosis: Generalized weakness;Acute pain  OT Problem  List: Decreased strength;Decreased range of motion;Decreased activity tolerance;Decreased safety awareness;Decreased knowledge of use of DME or AE;Decreased knowledge of precautions;Cardiopulmonary status limiting activity;Obesity;Pain OT Treatment Interventions: Self-care/ADL training;Therapeutic exercise;Energy conservation;DME and/or AE instruction;Therapeutic activities;Patient/family education   OT Goals Acute Rehab OT Goals OT Goal Formulation: With patient Time For Goal Achievement: 10/23/11 Potential to Achieve Goals: Good ADL Goals Pt Will Perform Lower Body Bathing: with min assist;with caregiver independent in assisting;Sit to stand from bed;Unsupported;with adaptive equipment;with cueing (comment type and amount) ADL Goal: Lower Body Bathing - Progress: Goal set today Pt Will Perform Lower Body Dressing: with min assist;with caregiver independent in assisting;Sit to stand from bed;Unsupported;with adaptive equipment;with cueing (comment type and amount) ADL Goal: Lower Body Dressing - Progress: Goal set today Pt Will Transfer to Toilet: with supervision;Ambulation;with DME;3-in-1 ADL Goal: Toilet Transfer - Progress: Goal set today Pt Will Perform Toileting - Clothing Manipulation: with supervision;Sitting on 3-in-1 or toilet;Standing;with cueing (comment type and amount) ADL Goal: Toileting - Clothing Manipulation - Progress: Goal set today Pt Will Perform Toileting - Hygiene: with modified independence;Sitting on 3-in-1 or toilet;Leaning right and/or left on 3-in-1/toilet ADL Goal: Toileting - Hygiene - Progress: Goal set today Pt Will Perform Tub/Shower Transfer: Tub transfer;with min assist;with caregiver independent in assisting;Ambulation;Transfer tub bench ADL Goal: Tub/Shower Transfer - Progress: Goal set today  Visit Information  Last OT Received On: 10/16/11 Assistance Needed: +1    Subjective Data      Prior Functioning     Home Living Lives With:  Spouse Available Help at Discharge: Family;Available 24 hours/day Type of Home: House Home Access: Stairs to enter [employer] of Steps: 2 Entrance Stairs-Rails: None Home Layout: One level Bathroom Shower/Tub: Forensic scientist: Handicapped height Bathroom Accessibility: Yes How Accessible: Accessible via walker Home Adaptive  Equipment: Walker - rolling Prior Function Level of Independence: Independent with assistive device(s) (Cane for balance.) Able to Take Stairs?: Yes Driving: Yes Vocation: Retired Dominant Hand: Right         Vision/Perception     Cognition  Overall Cognitive Status: Impaired Area of Impairment: Memory;Problem solving Arousal/Alertness: Awake/alert Orientation Level: Appears intact for tasks assessed Behavior During Session: [facility] for tasks performed Memory: Decreased recall of precautions Problem Solving: min A for functional basic    Extremity/Trunk Assessment Right Upper Extremity Assessment RUE ROM/Strength/Tone: Within functional levels Left Upper Extremity Assessment LUE ROM/Strength/Tone: Within functional levels Right Lower Extremity Assessment RLE ROM/Strength/Tone: WFL for tasks assessed Left Lower Extremity Assessment LLE ROM/Strength/Tone: Deficits Trunk Assessment Trunk Assessment: Normal     Mobility Bed Mobility Bed Mobility: Supine to Sit Supine to Sit: HOB flat;2: Max assist;With rails Sit to Supine: 3: Mod assist;HOB flat Details for Bed Mobility Assistance: Asisst for L LE. Cues for sequencing pt used rail  Transfers Transfers: Sit to Stand;Stand to Sit Sit to Stand: 3: Mod assist;With upper extremity assist;From bed Stand to Sit: 4: Min assist;With upper extremity assist;To bed Details for Transfer Assistance: vc for hand placement and sequencing  (difficulty remembering process)     Shoulder Instructions     Exercise  BUE strengthening with theraband   Balance     End of Session  OT - End of Session Equipment Utilized During Treatment: Gait belt Activity Tolerance: Patient tolerated treatment well Patient left: in bed;with call bell/phone within reach;with family/visitor present Nurse Communication: Mobility status;Precautions CPM Left Knee CPM Left Knee: On  GO     Kohner Orlick,HILLARY 10/16/2011, 6:40 PM [facility], OTR/L  [phone] 10/16/2011

## 2011-10-16 NOTE — Progress Notes (Signed)
Dangled pt last night, tolerated well. C/o minimal pain during the night. Assisted her out of bed this AM to the chair, 1+ assist with walker and immobilizer on. Tolerated very well, currently sitting comfortably in chair.

## 2011-10-16 NOTE — Progress Notes (Signed)
Subjective: Doing well.  Pain controlled.  No complaints.  Objective: Vital signs in last 24 hours: Temp:  [97.7 F (36.5 C)-98.5 F (36.9 C)] 98.5 F (36.9 C) (10/05 0623) Pulse Rate:  [56-63] 60  (10/05 0623) Resp:  [11-19] 18  (10/05 0800) BP: (94-173)/(47-96) 99/51 mmHg (10/05 0623) SpO2:  [96 %-100 %] 98 % (10/05 0800) Weight:  [85.8 kg (189 lb 2.5 oz)] 85.8 kg (189 lb 2.5 oz) (10/04 1416)  Intake/Output from previous day: 10/04 0701 - 10/05 0700 In: 3390 [P.O.:840; I.V.:2200] Out: 1525 [Urine:1000; Drains:425; Blood:100] Intake/Output this shift:     Basename 10/16/11 0655  HGB 9.2*    Basename 10/16/11 0655  WBC 13.9*  RBC 2.89*  HCT 27.6*  PLT 153    Basename 10/16/11 0655  NA 134*  K 4.5  CL 101  CO2 22  BUN 17  CREATININE 1.23*  GLUCOSE 129*  CALCIUM 8.5   No results found for this basename: LABPT:2,INR:2 in the last 72 hours  Exam:  Dressing c/d/i.  Calf nt, nvi.   Assessment/Plan: Bed to chair.  Continue therapy.  Dressing change tomorrow.   Increase iv rate to 90cc/hr.     Eirik Schueler M 10/16/2011, 11:17 AM

## 2011-10-16 NOTE — Progress Notes (Signed)
Physical Therapy Treatment Patient Details Name: Rachel Green MRN: [id number] DOB: [birth year] Today's Date: 10/16/2011 Time: 4098-1191 PT Time Calculation (min): 21 min  PT Assessment / Plan / Recommendation Comments on Treatment Session  Pt had a little trouble recalling instructions from this mornings session.      Follow Up Recommendations  Home health PT;Supervision - Intermittent     Does the patient have the potential to tolerate intense rehabilitation     Barriers to Discharge None      Equipment Recommendations  None recommended by PT    Recommendations for Other Services    Frequency 7X/week   Plan Discharge plan remains appropriate;Frequency remains appropriate    Precautions / Restrictions Precautions Precautions: Fall Required Braces or Orthoses: Knee Immobilizer - Left Restrictions Weight Bearing Restrictions: Yes LLE Weight Bearing: Weight bearing as tolerated   Pertinent Vitals/Pain Pt reporting no pain in knee.     Mobility  Bed Mobility Bed Mobility: Supine to Sit Supine to Sit: 4: Min assist;HOB flat Sit to Supine: 3: Mod assist;HOB flat Details for Bed Mobility Assistance: Asisst for L LE. Cues for sequencing pt used rail  Transfers Transfers: Sit to Stand;Stand to Sit Sit to Stand: 4: Min assist;From bed;With upper extremity assist Stand to Sit: 4: Min guard;To chair/3-in-1;With upper extremity assist Details for Transfer Assistance: Verbal cue for hand placement and position of L LE.  Cues to shift wt anteriorly seconary to pt presenting with posterior lean.  Ambulation/Gait Ambulation/Gait Assistance: 4: Min guard Ambulation Distance (Feet): 12 Feet Assistive device: Rolling walker Ambulation/Gait Assistance Details: cues to increase gait speed. cues for gait sequencing.  Gait Pattern: Step-to pattern;Decreased stride length;Decreased stance time - left;Decreased hip/knee flexion - left Gait velocity: slow Stairs: No Wheelchair  Mobility Wheelchair Mobility: No    Exercises Total Joint Exercises Ankle Circles/Pumps: AROM;Both;10 reps   PT Diagnosis: Difficulty walking;Acute pain  PT Problem List: Decreased range of motion;Decreased mobility;Decreased strength;Decreased activity tolerance;Decreased knowledge of use of DME;Pain;Decreased knowledge of precautions PT Treatment Interventions: Gait training;Stair training;Functional mobility training;Therapeutic activities;Patient/family education;DME instruction   PT Goals Acute Rehab PT Goals PT Goal Formulation: With patient Time For Goal Achievement: 10/23/11 Potential to Achieve Goals: Good Pt will go Supine/Side to Sit: with supervision PT Goal: Supine/Side to Sit - Progress: Progressing toward goal Pt will go Sit to Supine/Side: with supervision PT Goal: Sit to Supine/Side - Progress: Progressing toward goal Pt will go Sit to Stand: with supervision PT Goal: Sit to Stand - Progress: Progressing toward goal Pt will go Stand to Sit: with supervision PT Goal: Stand to Sit - Progress: Progressing toward goal Pt will Transfer Bed to Chair/Chair to Bed: with supervision PT Transfer Goal: Bed to Chair/Chair to Bed - Progress: Progressing toward goal Pt will Ambulate: 51 - 150 feet;with rolling walker;with supervision PT Goal: Ambulate - Progress: Progressing toward goal Pt will Go Up / Down Stairs: 1-2 stairs;with least restrictive assistive device;with min assist PT Goal: Up/Down Stairs - Progress: Goal set today  Visit Information  Last PT Received On: 10/16/11 Assistance Needed: +1    Subjective Data  Subjective: I cant remember which foot to use first when walking.  Patient Stated Goal: go home.    Cognition  Overall Cognitive Status: Appears within functional limits for tasks assessed/performed Arousal/Alertness: Awake/alert Orientation Level: Oriented X4 / Intact Behavior During Session: WFL for tasks performed    Balance     End of Session PT -  End of Session Equipment Utilized During Treatment:  Gait belt;Left lower extremity prosthesis Activity Tolerance: Patient tolerated treatment well Patient left: Other (comment) (in bathroom with CNA) Nurse Communication: Mobility status;Weight bearing status CPM Left Knee CPM Left Knee: Off   GP     Belma Dyches 10/16/2011, 3:04 PM Majesti Gambrell L. Kiandra Sanguinetti DPT [phone]

## 2011-10-17 DIAGNOSIS — D62 Acute posthemorrhagic anemia: Secondary | ICD-10-CM | POA: Diagnosis not present

## 2011-10-17 LAB — BASIC METABOLIC PANEL
BUN: 13 mg/dL (ref 6–23)
CO2: 25 mEq/L (ref 19–32)
Chloride: 101 mEq/L (ref 96–112)
GFR calc Af Amer: 58 mL/min — ABNORMAL LOW (ref >90)
Glucose, Bld: 114 mg/dL — ABNORMAL HIGH (ref 70–99)
Potassium: 4.1 mEq/L (ref 3.5–5.1)

## 2011-10-17 LAB — CBC
HCT: 26.8 % — ABNORMAL LOW (ref 36.0–46.0)
Hemoglobin: 8.8 g/dL — ABNORMAL LOW (ref 12.0–15.0)
RBC: 2.81 MIL/uL — ABNORMAL LOW (ref 3.87–5.11)
RDW: 14.1 % (ref 11.5–15.5)
WBC: 13.1 10*3/uL — ABNORMAL HIGH (ref 4.0–10.5)

## 2011-10-17 MED ORDER — PSEUDOEPHEDRINE HCL ER 120 MG PO TB12
120.0000 mg | ORAL_TABLET | Freq: Two times a day (BID) | ORAL | Status: DC | PRN
  Administered 2011-10-17: 120 mg via ORAL
  Filled 2011-10-17: qty 1

## 2011-10-17 MED ORDER — FUROSEMIDE 10 MG/ML IJ SOLN
20.0000 mg | Freq: Once | INTRAMUSCULAR | Status: AC
  Administered 2011-10-17: 20 mg via INTRAVENOUS
  Filled 2011-10-17: qty 2

## 2011-10-17 NOTE — Progress Notes (Signed)
Physical Therapy Treatment Patient Details Name: Rachel Green MRN: [id number] DOB: Aug 19, 1940 Today's Date: 10/17/2011 Time: 4098-1191 PT Time Calculation (min): 34 min  PT Assessment / Plan / Recommendation Comments on Treatment Session  s/p TKA; Making steady improvements; Pain is limitting L knee flexion    Follow Up Recommendations  Home health PT;Supervision - Intermittent     Does the patient have the potential to tolerate intense rehabilitation     Barriers to Discharge        Equipment Recommendations  None recommended by PT    Recommendations for Other Services    Frequency 7X/week   Plan Discharge plan remains appropriate;Frequency remains appropriate    Precautions / Restrictions Precautions Precautions: Fall;Knee Required Braces or Orthoses: Knee Immobilizer - Left Restrictions LLE Weight Bearing: Weight bearing as tolerated   Pertinent Vitals/Pain 4/10 L knee pain, was premedicated    Mobility  Bed Mobility Bed Mobility: Supine to Sit;Sit to Supine Supine to Sit: 4: Min guard Sit to Supine: 4: Min assist Details for Bed Mobility Assistance: Asisst for L LE. Cues for sequencing pt used rail  Transfers Transfers: Sit to Stand;Stand to Sit Sit to Stand: 4: Min assist;From chair/3-in-1;With upper extremity assist Stand to Sit: 4: Min assist;With upper extremity assist;To bed Details for Transfer Assistance: vc for hand placement and sequencing  Ambulation/Gait Ambulation/Gait Assistance: 4: Min guard (with and without physical contact) Ambulation Distance (Feet): 45 Feet Assistive device: Rolling walker Ambulation/Gait Assistance Details: continued cues for gait sequence and to acitvate L quad for stance stability Gait Pattern: Step-to pattern Gait velocity: slow    Exercises Total Joint Exercises Ankle Circles/Pumps: AROM;Both;10 reps Quad Sets: AROM;Left;10 reps;Supine Short Arc Quad: AROM;Left;10 reps;Supine Heel Slides: AAROM;Left;10  reps;Supine Straight Leg Raises: AAROM;Left;10 reps;Supine   PT Diagnosis:    PT Problem List:   PT Treatment Interventions:     PT Goals Acute Rehab PT Goals Time For Goal Achievement: 10/23/11 Potential to Achieve Goals: Good Pt will go Supine/Side to Sit: with supervision PT Goal: Supine/Side to Sit - Progress: Progressing toward goal Pt will go Sit to Supine/Side: with supervision PT Goal: Sit to Supine/Side - Progress: Progressing toward goal Pt will go Sit to Stand: with supervision PT Goal: Sit to Stand - Progress: Progressing toward goal Pt will go Stand to Sit: with supervision PT Goal: Stand to Sit - Progress: Progressing toward goal Pt will Ambulate: 51 - 150 feet;with rolling walker;with supervision PT Goal: Ambulate - Progress: Progressing toward goal  Visit Information  Last PT Received On: 10/17/11 Assistance Needed: +1    Subjective Data  Subjective: Agreeable to amb (doesn't want to go in hallway if dentures aren't in) Patient Stated Goal: go home.    Cognition  Overall Cognitive Status: Appears within functional limits for tasks assessed/performed Arousal/Alertness: Awake/alert Orientation Level: Appears intact for tasks assessed Behavior During Session: [facility] for tasks performed Memory: Decreased recall of precautions Problem Solving: min A for functional basic    Balance     End of Session PT - End of Session Equipment Utilized During Treatment: Gait belt Activity Tolerance: Patient tolerated treatment well Patient left: with call bell/phone within reach;Other (comment) (in bathroom on commode; RN aware) Nurse Communication: Mobility status (pt in bathroom)   GP     Olen Pel [place], [place] [phone]  10/17/2011, 12:43 PM

## 2011-10-17 NOTE — Progress Notes (Signed)
Subjective: C/o feeling fatigued, dizzy/lightheaded when up with therapy.  No  C/o cp, sob.    Objective: Vital signs in last 24 hours: Temp:  [97.7 F (36.5 C)-97.9 F (36.6 C)] 97.9 F (36.6 C) (10/06 2536) Pulse Rate:  [65-69] 69  (10/06 0638) Resp:  [18-20] 18  (10/06 0800) BP: (110-122)/(54-60) 122/54 mmHg (10/06 0638) SpO2:  [95 %-100 %] 100 % (10/06 0729)  Intake/Output from previous day: 10/05 0701 - 10/06 0700 In: 3040 [P.O.:1640; I.V.:1400] Out: 1190 [Urine:950; Drains:240] Intake/Output this shift:     Basename 10/17/11 0515 10/16/11 0655  HGB 8.8* 9.2*    Basename 10/17/11 0515 10/16/11 0655  WBC 13.1* 13.9*  RBC 2.81* 2.89*  HCT 26.8* 27.6*  PLT 146* 153    Basename 10/17/11 0515 10/16/11 0655  NA 135 134*  K 4.1 4.5  CL 101 101  CO2 25 22  BUN 13 17  CREATININE 1.10 1.23*  GLUCOSE 114* 129*  CALCIUM 8.8 8.5   No results found for this basename: LABPT:2,INR:2 in the last 72 hours  Exam:  Wound looks good.  Staples intact.  No drainage or signs of infection.  Calf nt, nvi.  Drain removed.    Assessment/Plan: -symptomatic ABLA.  Transfuse 2 units prbc's .  Possible d/c home tomorrow.   -dr Madelon Lips to follow in morning.     Rachel Green M 10/17/2011, 11:16 AM

## 2011-10-17 NOTE — Progress Notes (Signed)
Physical Therapy Note   10/17/11 1700  PT Visit Information  Last PT Received On 10/17/11  PT Time Calculation  PT Start Time 1626  PT Stop Time 1701  PT Time Calculation (min) 35 min  Subjective Data  Subjective Pt and husband with questions about goinghome  Patient Stated Goal go home.   Precautions  Precautions Fall;Knee  Required Braces or Orthoses Knee Immobilizer - Left  Restrictions  LLE Weight Bearing WBAT  Cognition  Overall Cognitive Status Appears within functional limits for tasks assessed/performed  Arousal/Alertness Awake/alert  Orientation Level Appears intact for tasks assessed  Behavior During Session [facility] for tasks performed  Bed Mobility  Bed Mobility Supine to Sit;Sit to Supine  Supine to Sit 4: Min assist (without rails)  Sit to Supine 4: Min assist  Details for Bed Mobility Assistance Min assist with supine to sit handheld assist (demonstrated for husband) to help elevat trunk off of bed; min assist with supine to sit for LLE  Transfers  Transfers Sit to Stand;Stand to Sit  Sit to Stand 4: Min assist;From chair/3-in-1;With upper extremity assist  Stand to Sit 4: Min assist;With upper extremity assist;To bed  Details for Transfer Assistance vc for hand placement and sequencing ; somewhat unsteady with initial stand; tends to stand with one hand on RW, and appropriately pt asked her husband to steady RW  Ambulation/Gait  Ambulation/Gait Assistance 4: Min guard  Ambulation Distance (Feet) 25 Feet  Assistive device Rolling walker  Ambulation/Gait Assistance Details cues for gait sequence and to activate quad for left stnace stability  Gait Pattern Step-to pattern  Gait velocity slow  Stairs Pt and husband with many questions re: stairs; Took husband to gym and demonstrated technique for backwards without rails to him, alos provided handout, and explained that they will practice steps next session  PT - End of Session  Equipment Utilized During Treatment Gait  belt  Activity Tolerance Patient tolerated treatment well  Patient left in bed;with call bell/phone within reach  PT - Assessment/Plan  Comments on Treatment Session Limited activity for pm session per PA request to wait until pt receives blood (IV team in to start line at end of session), still, pt was requesting to go to bathroom, so assisted her with that; Will have husband assist at home, and should be on track to dc home; May need both am and pm PT sessions prior to dc  PT Plan Discharge plan remains appropriate;Frequency remains appropriate  PT Frequency 7X/week  Follow Up Recommendations Home health PT;Supervision - Intermittent  Equipment Recommended None recommended by PT  Acute Rehab PT Goals  Time For Goal Achievement 10/23/11  Potential to Achieve Goals Good  Pt will go Supine/Side to Sit with supervision  PT Goal: Supine/Side to Sit - Progress Progressing toward goal  Pt will go Sit to Supine/Side with supervision  PT Goal: Sit to Supine/Side - Progress Progressing toward goal  Pt will go Sit to Stand with supervision  PT Goal: Sit to Stand - Progress Progressing toward goal  Pt will go Stand to Sit with supervision  PT Goal: Stand to Sit - Progress Progressing toward goal  Pt will Ambulate 51 - 150 feet;with rolling walker;with supervision  PT Goal: Ambulate - Progress Progressing toward goal  PT General Charges  $$ ACUTE PT VISIT 1 Procedure  PT Treatments  $Gait Training 8-22 mins  $Therapeutic Activity 8-22 mins    [place], Rafael Capo [phone]

## 2011-10-18 LAB — CBC
Hemoglobin: 11.7 g/dL — ABNORMAL LOW (ref 12.0–15.0)
MCH: 31.6 pg (ref 26.0–34.0)
RBC: 3.7 MIL/uL — ABNORMAL LOW (ref 3.87–5.11)

## 2011-10-18 LAB — BASIC METABOLIC PANEL
CO2: 29 mEq/L (ref 19–32)
Calcium: 8.9 mg/dL (ref 8.4–10.5)
Glucose, Bld: 103 mg/dL — ABNORMAL HIGH (ref 70–99)
Sodium: 137 mEq/L (ref 135–145)

## 2011-10-18 NOTE — Progress Notes (Signed)
Physical Therapy Treatment Patient Details Name: Rachel Green MRN: [id number] DOB: 10-04-1940 Today's Date: 10/18/2011 Time: 4098-1191 PT Time Calculation (min): 63 min  PT Assessment / Plan / Recommendation Comments on Treatment Session  Pt has not progressed as anticipated on eval.  Pt presents as a significant falls risk secondary to impaired balance and generalized weakness.  Pt having difficulty recalling instructions for safety with mobilty.  At this time pt lacks praxis to safely return to home with aid of her spouse.   Pt would benefit from consideration of short term SNF placement for rehab.   Acute PT willl continue to follow and progress pt.   Spoke with case Production designer, theatre/television/film and RN about discharge recommendations.       Follow Up Recommendations  Post acute inpatient;Supervision/Assistance - 24 hour     Does the patient have the potential to tolerate intense rehabilitation  No, Recommend SNF  Barriers to Discharge        Equipment Recommendations  3 in 1 bedside comode;Tub/shower bench    Recommendations for Other Services    Frequency 7X/week   Plan Discharge plan needs to be updated    Precautions / Restrictions Precautions Precautions: Fall;Knee Precaution Comments: Pt has history of falling and impaired balance at baseline.   Required Braces or Orthoses: Knee Immobilizer - Left Knee Immobilizer - Left: On when out of bed or walking Restrictions Weight Bearing Restrictions: Yes LLE Weight Bearing: Weight bearing as tolerated    Pertinent Vitals/Pain 6/10n pain in L Knee.  SpO2 dropped to low 80s on room air during activity.      Mobility  Bed Mobility Bed Mobility: Sitting - Scoot to Edge of Bed;Sit to Supine;Supine to Sit;Rolling Right Rolling Right: 3: Mod assist;With rail Rolling Left: 3: Mod assist;With rail Supine to Sit: 3: Mod assist;HOB flat Sitting - Scoot to Edge of Bed: 4: Min assist Sit to Supine: 3: Mod assist;HOB flat Details for Bed Mobility  Assistance: Pt requires assist for L LE and trunk for bed mobility.  Sit-> supine pt presents with poor trunk control falling back into supine prematurely.  Instructed pt in bed mobility using leg lifter unsuccessfully secondary to generalized weakness.   Transfers Transfers: Sit to Stand;Stand to [employer] Transfers Sit to Stand: 4: Min guard;With upper extremity assist;From chair/3-in-1;With armrests;4: Min assist Stand to Sit: 4: Min guard;With upper extremity assist;To chair/3-in-1 Stand Pivot Transfers: 4: Min guard;4: Min assist Details for Transfer Assistance: Pt requires multiple attempts to stand without assistance. Requires step by step verbal cueing for hand placement and management or L LE.  PT unsteady initially, requires several seconds to "get her balance".   Pt continues to have difficulty recalling technique without prompting. (5 trials sit<>stand).  Pt requires step by step cueing for stand pivot transfer and asist to maintain balance.    Ambulation/Gait Ambulation/Gait Assistance: Not tested (comment) Stairs: Yes Stairs Assistance: 3: Mod assist Stairs Assistance Details (indicate cue type and reason): Spouse assisting pt with RW management.  PT assist to steady pt secondary loss of balance posteriorly x 2.  Verbal cues and demonstration of proper technique for posterior stair negotiation with RW.   Stair Management Technique: Step to pattern;With walker Number of Stairs: 2     Exercises     PT Diagnosis:    PT Problem List:   PT Treatment Interventions:     PT Goals Acute Rehab PT Goals PT Goal Formulation: With patient/family Potential to Achieve Goals: Good Pt will go Supine/Side  to Sit: with supervision PT Goal: Supine/Side to Sit - Progress: Progressing toward goal Pt will go Sit to Supine/Side: with supervision PT Goal: Sit to Supine/Side - Progress: Progressing toward goal Pt will go Sit to Stand: with supervision PT Goal: Sit to Stand - Progress:  Progressing toward goal Pt will go Stand to Sit: with supervision PT Goal: Stand to Sit - Progress: Progressing toward goal Pt will Transfer Bed to Chair/Chair to Bed: with supervision PT Transfer Goal: Bed to Chair/Chair to Bed - Progress: Progressing toward goal Pt will Ambulate: 51 - 150 feet;with rolling walker;with supervision Pt will Go Up / Down Stairs: 1-2 stairs;with least restrictive assistive device;with min assist PT Goal: Up/Down Stairs - Progress: Progressing toward goal  Visit Information  Last PT Received On: 10/18/11 Assistance Needed: +1    Subjective Data  Patient Stated Goal: Return to home when ready   Cognition  Overall Cognitive Status: Appears within functional limits for tasks assessed/performed Area of Impairment: Problem solving;Memory Arousal/Alertness: Awake/alert Orientation Level: Appears intact for tasks assessed Behavior During Session: [facility] for tasks performed Memory: Decreased recall of precautions Memory Deficits: Pt has difficulty recalling techniques for bed mobility and transfers without prompting.  Problem Solving: min A for functional basic    Balance  Balance Balance Assessed: Yes Static Standing Balance Static Standing - Balance Support: Bilateral upper extremity supported Static Standing - Level of Assistance: 4: Min assist Static Standing - Comment/# of Minutes: Pt had multiple LOB episodes in static standing.  Pt presents with posterior lean in standing and is unable to re-establish her balance without assistance.    End of Session PT - End of Session Equipment Utilized During Treatment: Gait belt;Left knee immobilizer Activity Tolerance: Patient tolerated treatment well Patient left: in bed;with call bell/phone within reach;with family/visitor present Nurse Communication: Mobility status;Other (comment) (Pt lack of progress. )   GP     Gearl Baratta 10/18/2011, 4:49 PM Desirae Mancusi L. Jermika Olden DPT [phone]

## 2011-10-18 NOTE — Care Management Note (Signed)
    Page 1 of 2   10/18/2011     10:48:17 AM   CARE MANAGEMENT NOTE 10/18/2011  Patient:  Rachel Green,Rachel Green   Account Number:  [id number]  Date Initiated:  10/18/2011  Documentation initiated by:  Anette Guarneri  Subjective/Objective Assessment:   s/p left TKA  HHPT/RN  no DME needs     Action/Plan:   home with hh services   Anticipated DC Date:  10/18/2011   Anticipated DC Plan:  HOME W HOME HEALTH SERVICES      DC Planning Services  CM consult      [facility] Choice  HOME HEALTH   Choice offered to / List presented to:  C-1 Patient        HH arranged  HH-1 RN  HH-2 PT      [facility] agency  [facility]   Status of service:  Completed, signed off Medicare Important Message given?   (If response is "NO", the following Medicare IM given date fields will be blank) Date Medicare IM given:   Date Additional Medicare IM given:    Discharge Disposition:  HOME W HOME HEALTH SERVICES  Per UR Regulation:  Reviewed for med. necessity/level of care/duration of stay  If discussed at Long Length of Stay Meetings, dates discussed:    Comments:  10/18/11  10:45  Anette Guarneri RN/CM Needs HHPT/RN, spoke with patient regarding d/c needs, patient choice for [facility] services is [place] contacted [place], arranged for HHPT/RN to begin after discharge home. Patient states she has RW/3n1 and CPM at home.

## 2011-10-18 NOTE — Progress Notes (Signed)
Subjective: 3 Days Post-Op Procedure(s) (LRB): TOTAL KNEE ARTHROPLASTY (Left) Patient reports pain as mild.    Objective: Vital signs in last 24 hours: Temp:  [97.5 F (36.4 C)-98.7 F (37.1 C)] 98.6 F (37 C) (10/07 0330) Pulse Rate:  [67-72] 68  (10/07 0330) Resp:  [18-20] 20  (10/07 0330) BP: (111-138)/(41-75) 121/58 mmHg (10/07 0330) SpO2:  [92 %-100 %] 100 % (10/06 2033)  Intake/Output from previous day: 10/06 0701 - 10/07 0700 In: 1370 [P.O.:240; I.V.:500; Blood:630] Out: -  Intake/Output this shift:     Basename 10/17/11 0515 10/16/11 0655  HGB 8.8* 9.2*    Basename 10/17/11 0515 10/16/11 0655  WBC 13.1* 13.9*  RBC 2.81* 2.89*  HCT 26.8* 27.6*  PLT 146* 153    Basename 10/17/11 0515 10/16/11 0655  NA 135 134*  K 4.1 4.5  CL 101 101  CO2 25 22  BUN 13 17  CREATININE 1.10 1.23*  GLUCOSE 114* 129*  CALCIUM 8.8 8.5   No results found for this basename: LABPT:2,INR:2 in the last 72 hours  Neurovascular intact Intact pulses distally Dorsiflexion/Plantar flexion intact Incision: dressing C/D/I and scant drainage No cellulitis present Compartment soft Has developed a small "dime size" blister on the medial mid knee near the incision also with some bruising directly around the incision which was present immediately following surgery likely from the drapes.  No erythema or active drainage.  Assessment/Plan: 3 Days Post-Op Procedure(s) (LRB): TOTAL KNEE ARTHROPLASTY (Left) Acute Blood Loss Anemia  Up with therapy Discharge home with home health Review daily labs once available, s/p transfusion yesterday if labs stable and does well with PT this AM may discharge home with [facility] PT.   Lovenox teaching   Margart Sickles 10/18/2011, 7:42 AM

## 2011-10-18 NOTE — Progress Notes (Signed)
Occupational Therapy Treatment Patient Details Name: Rachel Green MRN: [id number] DOB: 12-21-1940 Today's Date: 10/18/2011 Time: 3244-0102 OT Time Calculation (min): 32 min  OT Assessment / Plan / Recommendation Comments on Treatment Session Pt currently requires Mod A LE bathe & dressing w/ A/E use noted. Pt moving very slowly & is In agreement for SNF rehab as pt & pt's husband do not feel that he can assist her as needed at this time. Cont acute OT towards POC/goals.    Follow Up Recommendations  Skilled nursing facility;Home health OT    Barriers to Discharge       Equipment Recommendations  3 in 1 bedside comode;Tub/shower bench    Recommendations for Other Services    Frequency Min 3X/week   Plan Discharge plan needs to be updated    Precautions / Restrictions Precautions Precautions: Fall;Knee Required Braces or Orthoses: Knee Immobilizer - Left Knee Immobilizer - Left: On when out of bed or walking Restrictions Weight Bearing Restrictions: Yes LLE Weight Bearing: Weight bearing as tolerated   Pertinent Vitals/Pain Pt reported 7/10 pain L knee, RN made aware, pt repositioned and ice applied, rest    ADL  Eating/Feeding: Performed;Independent Where Assessed - Eating/Feeding: Chair Grooming: Performed;Wash/dry hands;Wash/dry face;Min guard Where Assessed - Grooming: Unsupported standing Upper Body Bathing: Performed;Chest;Right arm;Left arm;Abdomen;Set up Where Assessed - Upper Body Bathing: Supported sitting Lower Body Bathing: Performed;Moderate assistance Where Assessed - Lower Body Bathing: Supported sitting Lower Body Dressing: Performed;Moderate assistance;Other (comment) (w/ A/E) Where Assessed - Lower Body Dressing: Supported sitting Toilet Transfer: Performed;Minimal assistance Toilet Transfer Method: Sit to Barista: Raised toilet seat with arms (or 3-in-1 over toilet) Toileting - Clothing Manipulation and Hygiene:  Performed;Supervision/safety Where Assessed - Engineer, mining and Hygiene: Sit on 3-in-1 or toilet Equipment Used: Rolling walker;Reacher;Sock aid Transfers/Ambulation Related to ADLs: Mod A LB bahte/dressing w/ A/E ADL Comments: Pt currently requires Mod A LE bathe & dressing w/ A/E use noted. She is currently stating that she & her husband may not be able to go home w/ HH, but possibly SNF rehab. Pt moving very slowly, required Mod A LE dress/bathe & use of A/E. In agreement for SNF rehab as pt & pt's husband do not feel that he can assist her as needed at this time.    OT Diagnosis:   Generalized weakness s/p L TKR OT Problem List:  Generalized weakness, pain, decreased caregiver support OT Treatment Interventions:  ADL retraining, A/E training, Pt/family education  OT Goals ADL Goals Pt Will Perform Lower Body Bathing: with min assist;with caregiver independent in assisting;Sit to stand from chair;Sit to stand from bed;with adaptive equipment;Unsupported ADL Goal: Lower Body Bathing - Progress: Progressing toward goals Pt Will Perform Lower Body Dressing: with min assist;with caregiver independent in assisting;Sit to stand from bed;Unsupported;with adaptive equipment ADL Goal: Lower Body Dressing - Progress: Progressing toward goals Pt Will Transfer to Toilet: with supervision;with DME;3-in-1;Ambulation ADL Goal: Toilet Transfer - Progress: Progressing toward goals Pt Will Perform Toileting - Clothing Manipulation: with supervision;Sitting on 3-in-1 or toilet;Standing;with cueing (comment type and amount) ADL Goal: Toileting - Clothing Manipulation - Progress: Progressing toward goals Pt Will Perform Toileting - Hygiene: with modified independence;Sitting on 3-in-1 or toilet;Leaning right and/or left on 3-in-1/toilet ADL Goal: Toileting - Hygiene - Progress: Progressing toward goals  Visit Information  Last OT Received On: 10/18/11 Assistance Needed: +1    Subjective  Data  Subjective: My knee hurts Patient Stated Goal: SNF rehab vs home w/ HHOT  Prior Functioning       Cognition  Overall Cognitive Status: Appears within functional limits for tasks assessed/performed Area of Impairment: Problem solving;Memory Arousal/Alertness: Awake/alert Orientation Level: Appears intact for tasks assessed Behavior During Session: [facility] for tasks performed Memory: Decreased recall of precautions Problem Solving: min A for functional basic    Mobility  Bed Mobility Bed Mobility: Supine to Sit;Sitting - Scoot to Edge of Bed Supine to Sit: 3: Mod assist;HOB flat Sitting - Scoot to Edge of Bed: 3: Mod assist Sit to Supine: Not Tested (comment) Details for Bed Mobility Assistance: Assist for L LE and to raise trunk from the bed.  Pt required assist to scoot hips to EOB.  Pt leaning back in sitting and having difficulty using UEs to assist. Transfers Transfers: Sit to Stand;Stand to Sit Sit to Stand: 4: Min guard;From chair/3-in-1;With armrests;With upper extremity assist Stand to Sit: 4: Min guard;To chair/3-in-1;With upper extremity assist Details for Transfer Assistance: VC's for safety and sequencing of transfers.               End of Session OT - End of Session Equipment Utilized During Treatment: Gait belt;Other (comment) (RW) Activity Tolerance: Patient limited by pain Patient left: in chair;with call bell/phone within reach;with nursing in room;with family/visitor present  GO     Rachel Green 10/18/2011, 2:00 PM

## 2011-10-18 NOTE — Discharge Summary (Signed)
Home PATIENT ID: Rachel Green        MRN:  [id number]          DOB/AGE: 70-Apr-1942 / 70 y.o.    DISCHARGE SUMMARY  ADMISSION DATE:    10/15/2011 DISCHARGE DATE:   10/18/2011   ADMISSION DIAGNOSIS: DJD LT KNEE    DISCHARGE DIAGNOSIS:  DJD LT KNEE    ADDITIONAL DIAGNOSIS: Active Problems:  Acute blood loss anemia  Past Medical History  Diagnosis Date  . Asthma   . Restrictive lung disease   . HTN (hypertension)   . Thyroid disease     with goiter  . GERD (gastroesophageal reflux disease)   . Hiatal hernia   . Multiple allergies     grass, Pecan trees  . Hypothyroidism   . Heart murmur     "years ago" not now  . Shortness of breath     with activity  . Sinus headache   . Cancer     Skin cancer- face- right  . Arthritis   . Neuropathy   . Hemorrhoid   . 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 Ecchymosis     PROCEDURE: Procedure(s): TOTAL KNEE ARTHROPLASTY Left  on 10/15/2011  CONSULTS:     HISTORY:  See H&P in chart  HOSPITAL COURSE:  Rachel Green is a 71 y.o. admitted on 10/15/2011 and found to have a diagnosis of DJD LT KNEE.  After appropriate laboratory studies were obtained  they were taken to the operating room on 10/15/2011 and underwent Procedure(s): TOTAL KNEE ARTHROPLASTY Left.   They were given perioperative antibiotics:  Anti-infectives     Start     Dose/Rate Route Frequency Ordered Stop   10/15/11 1400   clindamycin (CLEOCIN) IVPB 600 mg        600 mg 100 mL/hr over 30 Minutes Intravenous Every 6 hours 10/15/11 1346 10/15/11 2057   10/14/11 1434   clindamycin (CLEOCIN) IVPB 900 mg        900 mg 100 mL/hr over 30 Minutes Intravenous 60 min pre-op 10/14/11 1434 10/15/11 0800        .  Tolerated the procedure well.  Placed with a foley intraoperatively.  Given Ofirmev at induction and for 24 hours.    POD #1, allowed out of bed to a chair.  PT for ambulation and exercise program.  Foley D/C'd in morning.  IV saline locked.  POD #2, continued PT and  ambulation.   Hemovac pulled.  Patient reported feeling dizzy and fatigued, Hgb 8.8 was transfused with 2 units PRBC.    POD#3 reports feeling somewhat better following transfusion.  Had difficulty with ambulation/mobility c/o weakness mainly in UE difficulty using the walker, PT after 2 sessions with PT they recommend SNF placement, SW was consulted for placement.  POD#4 pt reports feeling better than the day before but still weak, SW seeking SNF placement d/c by ambulance.  The remainder of the hospital course was dedicated to ambulation and strengthening.   The patient was discharged on 4 days post op in  Stable condition.  Blood products given:2 units CC PRBC  DIAGNOSTIC STUDIES: Recent vital signs: Patient Vitals for the past 24 hrs:  BP Temp Temp src Pulse Resp SpO2  10/18/11 0330 121/58 mmHg 98.6 F (37 C) Oral 68  20  -  10/18/11 0245 129/60 mmHg 98.5 F (36.9 C) Oral 71  18  -  10/18/11 0145 111/41 mmHg 98.6 F (37 C) Oral 67  18  -  10/18/11 0045 117/51 mmHg 98.7 F (37.1 C) Oral 70  18  -  10/18/11 0025 115/52 mmHg 98.6 F (37 C) Oral 67  20  -  10/17/11 2125 122/52 mmHg 98.6 F (37 C) Oral 68  18  -  10/17/11 2033 116/50 mmHg 98.7 F (37.1 C) - 67  18  100 %  10/17/11 1935 123/75 mmHg 98 F (36.7 C) Oral 72  18  95 %  10/17/11 1842 138/64 mmHg 97.9 F (36.6 C) - 72  20  -  10/17/11 1827 130/62 mmHg 98.4 F (36.9 C) - 72  20  -  10/17/11 1819 128/60 mmHg 98.4 F (36.9 C) - 72  20  -  10/17/11 1600 - - - - 18  -  10/17/11 1328 132/58 mmHg 97.5 F (36.4 C) - 71  18  92 %  10/17/11 1200 - - - - 20  -  10/17/11 0800 - - - - 18  -       Recent laboratory studies:  Basename 10/17/11 0515 10-22-2011 0655 10/12/11 1400  WBC 13.1* 13.9* 11.8*  HGB 8.8* 9.2* 12.5  HCT 26.8* 27.6* 38.0  PLT 146* 153 183    Basename 10/17/11 0515 10-22-11 0655 10/12/11 1400  NA 135 134* 143  K 4.1 4.5 4.7  CL 101 101 104  CO2 25 22 28   BUN 13 17 17   CREATININE 1.10 1.23* 1.23*    GLUCOSE 114* 129* 95  CALCIUM 8.8 8.5 10.5   Lab Results  Component Value Date   INR 0.96 10/12/2011     Recent Radiographic Studies :  No results found.  DISCHARGE INSTRUCTIONS:   DISCHARGE MEDICATIONS:     Medication List     As of 10/18/2011  7:48 AM    STOP taking these medications         aspirin 81 MG EC tablet      HYDROcodone-acetaminophen 5-325 MG per tablet   Commonly known as: NORCO/VICODIN      TAKE these medications         beclomethasone 80 MCG/ACT inhaler   Commonly known as: QVAR   Inhale 2 puffs into the lungs 2 (two) times daily.      calcium-vitamin D 500-200 MG-UNIT per tablet   Commonly known as: OSCAL WITH D   Take 1 tablet by mouth 2 (two) times daily.      enoxaparin 30 MG/0.3ML injection   Commonly known as: LOVENOX   Inject 0.3 mLs (30 mg total) into the skin every 12 (twelve) hours.      estrogens (conjugated) 0.3 MG tablet   Commonly known as: PREMARIN   Take 0.3 mg by mouth daily. Take daily for 21 days then do not take for 7 days.      fish oil-omega-3 fatty acids 1000 MG capsule   Take 1 g by mouth 2 (two) times daily.      furosemide 40 MG tablet   Commonly known as: LASIX   Take 40 mg by mouth daily.      gabapentin 300 MG capsule   Commonly known as: NEURONTIN   Take 300 mg by mouth 2 (two) times daily.      levothyroxine 50 MCG tablet   Commonly known as: SYNTHROID, LEVOTHROID   Take 50 mcg by mouth daily.      lisinopril 40 MG tablet   Commonly known as: PRINIVIL,ZESTRIL   Take 40 mg by mouth daily.      loratadine-pseudoephedrine 10-240  MG per 24 hr tablet   Commonly known as: CLARITIN-D 24-hour   Take 1 tablet by mouth daily.      methocarbamol 500 MG tablet   Commonly known as: ROBAXIN   Take 1 tablet (500 mg total) by mouth 4 (four) times daily. As needed for spasm      omeprazole 20 MG capsule   Commonly known as: PRILOSEC   Take 20 mg by mouth 2 (two) times daily.      oxyCODONE-acetaminophen 5-325  MG per tablet   Commonly known as: PERCOCET/ROXICET   1-2 tabs po q4-6hrs prn pain      potassium chloride SA 20 MEQ tablet   Commonly known as: K-DUR,KLOR-CON   Take 10 mEq by mouth daily.      propranolol 20 MG tablet   Commonly known as: INDERAL   Take 20 mg by mouth daily.      simvastatin 40 MG tablet   Commonly known as: ZOCOR   Take 40 mg by mouth at bedtime.      solifenacin 5 MG tablet   Commonly known as: VESICARE   Take 5 mg by mouth daily.      Theratrum Complete Tabs   Take 1 tablet by mouth daily.      VITAMIN B-1 PO   Take 1 tablet by mouth daily.        FOLLOW UP VISIT:       Follow-up Information    Call CAFFREY JR,W D, MD. (to make an appt on 10/28/11)    Contact information:   [address] [place], [place] [phone]          DISPOSITION: SNF CONDITION:  {Stable  Margart Sickles 10/18/2011, 7:48 AM

## 2011-10-18 NOTE — Progress Notes (Signed)
Physical Therapy Treatment Patient Details Name: Rachel Green MRN: [id number] DOB: 1940/03/13 Today's Date: 10/18/2011 Time: 7425-9563 PT Time Calculation (min): 43 min  PT Assessment / Plan / Recommendation Comments on Treatment Session  Pt requires assistance for bed mobility and supervision for gait and transfers.  Pt and spouse unsure if pt is ready (funcitonally) to go home.  Will see pt again today to determine d/c recommendations.      Follow Up Recommendations  Home health PT;Supervision/Assistance - 24 hour     Does the patient have the potential to tolerate intense rehabilitation     Barriers to Discharge        Equipment Recommendations  None recommended by PT    Recommendations for Other Services    Frequency 7X/week   Plan Discharge plan remains appropriate;Frequency remains appropriate    Precautions / Restrictions Precautions Precautions: Fall;Knee Required Braces or Orthoses: Knee Immobilizer - Left Knee Immobilizer - Left: On when out of bed or walking Restrictions Weight Bearing Restrictions: Yes LLE Weight Bearing: Weight bearing as tolerated    Pertinent Vitals/Pain Pt c/o7/10 pain in L Knee.  Applied Ice pack to knee and repositioned for pain relief.  Pt's SpO2 on Room air after ambulating down to 88%.      Mobility  Bed Mobility Bed Mobility: Supine to Sit;Sitting - Scoot to Edge of Bed Supine to Sit: 3: Mod assist;HOB flat Sitting - Scoot to Edge of Bed: 3: Mod assist Sit to Supine: Not Tested (comment) Details for Bed Mobility Assistance: Assist for L LE and to raise trunk from the bed.  Pt required assist to scoot hips to EOB.  Pt leaning back in sitting and having difficulty using UEs to assist. Transfers Transfers: Sit to Stand;Stand to Sit Sit to Stand: 4: Min guard;5: Supervision;With upper extremity assist;From bed;From chair/3-in-1 Stand to Sit: 4: Min guard;With upper extremity assist;To chair/3-in-1;With armrests Details for Transfer  Assistance: 3 Trials of each.  VCs for hand placement and positon of L LE to minimize pain.   Ambulation/Gait Ambulation/Gait Assistance: 4: Min guard Ambulation Distance (Feet): 75 Feet (75 feet first trial 60 feet second trial ) Assistive device: Rolling walker Ambulation/Gait Assistance Details: Cues to increase gait speed. Gait Pattern: Step-to pattern Gait velocity: 30 feet/42 seconds (0.714 feet/sec) General Gait Details: Pt unsure and unsteady Stairs: No    Exercises Total Joint Exercises Heel Slides: AAROM;Left;10 reps;Supine Goniometric ROM: 0-72 degrees AAROM in sitting.    PT Diagnosis:    PT Problem List:   PT Treatment Interventions:     PT Goals Acute Rehab PT Goals PT Goal Formulation: With patient/family Time For Goal Achievement: 10/23/11 Potential to Achieve Goals: Good Pt will go Supine/Side to Sit: with supervision PT Goal: Supine/Side to Sit - Progress: Progressing toward goal Pt will go Sit to Supine/Side: with supervision Pt will go Sit to Stand: with supervision PT Goal: Sit to Stand - Progress: Progressing toward goal Pt will go Stand to Sit: with supervision PT Goal: Stand to Sit - Progress: Progressing toward goal Pt will Transfer Bed to Chair/Chair to Bed: with supervision PT Transfer Goal: Bed to Chair/Chair to Bed - Progress: Progressing toward goal Pt will Ambulate: 51 - 150 feet;with rolling walker;with supervision PT Goal: Ambulate - Progress: Progressing toward goal Pt will Go Up / Down Stairs: 1-2 stairs;with least restrictive assistive device;with min assist  Visit Information  Last PT Received On: 10/18/11    Subjective Data  Subjective: My knee really hurts.  I  dont think I am ready to go home.   Patient Stated Goal: Return to home when ready   Cognition  Overall Cognitive Status: Appears within functional limits for tasks assessed/performed Area of Impairment: Memory;Problem solving Arousal/Alertness: Awake/alert Orientation  Level: Appears intact for tasks assessed Behavior During Session: [facility] for tasks performed Memory: Decreased recall of precautions Problem Solving: min A for functional basic    Balance     End of Session PT - End of Session Equipment Utilized During Treatment: Gait belt Activity Tolerance: Patient tolerated treatment well Patient left: in chair;with call bell/phone within reach;with family/visitor present Nurse Communication: Mobility status   GP     Egon Dittus 10/18/2011, 1:34 PM Chares Slaymaker L. Shakea Isip DPT [phone]

## 2011-10-19 DIAGNOSIS — D500 Iron deficiency anemia secondary to blood loss (chronic): Secondary | ICD-10-CM | POA: Diagnosis not present

## 2011-10-19 DIAGNOSIS — K449 Diaphragmatic hernia without obstruction or gangrene: Secondary | ICD-10-CM | POA: Diagnosis not present

## 2011-10-19 DIAGNOSIS — R0602 Shortness of breath: Secondary | ICD-10-CM | POA: Diagnosis not present

## 2011-10-19 DIAGNOSIS — G8918 Other acute postprocedural pain: Secondary | ICD-10-CM | POA: Diagnosis not present

## 2011-10-19 DIAGNOSIS — Z471 Aftercare following joint replacement surgery: Secondary | ICD-10-CM | POA: Diagnosis not present

## 2011-10-19 DIAGNOSIS — R269 Unspecified abnormalities of gait and mobility: Secondary | ICD-10-CM | POA: Diagnosis not present

## 2011-10-19 DIAGNOSIS — E039 Hypothyroidism, unspecified: Secondary | ICD-10-CM | POA: Diagnosis not present

## 2011-10-19 DIAGNOSIS — G43909 Migraine, unspecified, not intractable, without status migrainosus: Secondary | ICD-10-CM | POA: Diagnosis not present

## 2011-10-19 DIAGNOSIS — E079 Disorder of thyroid, unspecified: Secondary | ICD-10-CM | POA: Diagnosis not present

## 2011-10-19 DIAGNOSIS — K219 Gastro-esophageal reflux disease without esophagitis: Secondary | ICD-10-CM | POA: Diagnosis not present

## 2011-10-19 DIAGNOSIS — E049 Nontoxic goiter, unspecified: Secondary | ICD-10-CM | POA: Diagnosis not present

## 2011-10-19 DIAGNOSIS — J45909 Unspecified asthma, uncomplicated: Secondary | ICD-10-CM | POA: Diagnosis not present

## 2011-10-19 DIAGNOSIS — J984 Other disorders of lung: Secondary | ICD-10-CM | POA: Diagnosis not present

## 2011-10-19 DIAGNOSIS — M25569 Pain in unspecified knee: Secondary | ICD-10-CM | POA: Diagnosis not present

## 2011-10-19 DIAGNOSIS — I509 Heart failure, unspecified: Secondary | ICD-10-CM | POA: Diagnosis not present

## 2011-10-19 DIAGNOSIS — R011 Cardiac murmur, unspecified: Secondary | ICD-10-CM | POA: Diagnosis not present

## 2011-10-19 DIAGNOSIS — M129 Arthropathy, unspecified: Secondary | ICD-10-CM | POA: Diagnosis not present

## 2011-10-19 DIAGNOSIS — S8990XA Unspecified injury of unspecified lower leg, initial encounter: Secondary | ICD-10-CM | POA: Diagnosis not present

## 2011-10-19 DIAGNOSIS — Z96659 Presence of unspecified artificial knee joint: Secondary | ICD-10-CM | POA: Diagnosis not present

## 2011-10-19 DIAGNOSIS — I10 Essential (primary) hypertension: Secondary | ICD-10-CM | POA: Diagnosis not present

## 2011-10-19 DIAGNOSIS — M1710 Unilateral primary osteoarthritis, unspecified knee: Secondary | ICD-10-CM | POA: Diagnosis not present

## 2011-10-19 LAB — TYPE AND SCREEN
ABO/RH(D): O POS
Unit division: 0

## 2011-10-19 LAB — BASIC METABOLIC PANEL
BUN: 11 mg/dL (ref 6–23)
Chloride: 97 mEq/L (ref 96–112)
Creatinine, Ser: 0.93 mg/dL (ref 0.50–1.10)
Glucose, Bld: 99 mg/dL (ref 70–99)
Potassium: 3.5 mEq/L (ref 3.5–5.1)

## 2011-10-19 MED ORDER — DSS 100 MG PO CAPS: 100.0000 mg | ORAL_CAPSULE | Freq: Two times a day (BID) | ORAL | Status: DC

## 2011-10-19 NOTE — Progress Notes (Signed)
Agree with treatment and progress.  10/19/2011 Cephus Shelling, PT, DPT [phone]

## 2011-10-19 NOTE — Progress Notes (Signed)
Physical Therapy Treatment Patient Details Name: Rachel Green MRN: [id number] DOB: 1940-03-21 Today's Date: 10/19/2011 Time: 0752-0828 PT Time Calculation (min): 36 min  PT Assessment / Plan / Recommendation Comments on Treatment Session  Pt required cueing on all transfers and ambulation.  Pt discussed feeling weak and not having UE strength to hold her up as she ambulated.  Pt discussed going to rehab and feels this would be the better choice to remain safe and not injure herself at home. HEP goal was added to list of goals.    Follow Up Recommendations  Post acute inpatient;Supervision/Assistance - 24 hour     Does the patient have the potential to tolerate intense rehabilitation  No, Recommend SNF  Barriers to Discharge        Equipment Recommendations  3 in 1 bedside comode;Tub/shower bench    Recommendations for Other Services    Frequency 7X/week   Plan Discharge plan remains appropriate;Frequency remains appropriate    Precautions / Restrictions Precautions Precautions: Fall;Knee Required Braces or Orthoses: Knee Immobilizer - Left Knee Immobilizer - Left: On when out of bed or walking Restrictions Weight Bearing Restrictions: Yes LLE Weight Bearing: Weight bearing as tolerated   Pertinent Vitals/Pain 5/10 pain in R LE.    Mobility  Bed Mobility Bed Mobility: Supine to Sit;Sitting - Scoot to Edge of Bed Supine to Sit: 3: Mod assist;HOB flat Sitting - Scoot to Edge of Bed: 4: Min assist Details for Bed Mobility Assistance: (A) for UE and LE support.  Cues for proper technique and safety. Transfers Transfers: Sit to Stand;Stand to Sit Sit to Stand: 4: Min assist;From bed Stand to Sit: 4: Min assist;To chair/3-in-1 Details for Transfer Assistance: (A) with RW placement, balance, and slowing descent.  Pt able to arise on first attempt. Cues for hand placement, foot placement, proper technique, and safety. Ambulation/Gait Ambulation/Gait Assistance: 4: Min  guard Ambulation Distance (Feet): 30 Feet Assistive device: Rolling walker Ambulation/Gait Assistance Details: Cues for proper technique and step sequence.  Gait Pattern: Step-to pattern;Decreased stride length;Antalgic Stairs: No Wheelchair Mobility Wheelchair Mobility: No    Exercises Total Joint Exercises Ankle Circles/Pumps: AROM;Both;10 reps Quad Sets: AROM;Right;5 reps Heel Slides: AAROM;Left;5 reps (worked with sheet around foot for (a))   PT Diagnosis:    PT Problem List:   PT Treatment Interventions:     PT Goals Acute Rehab PT Goals PT Goal Formulation: With patient/family Time For Goal Achievement: 10/23/11 Potential to Achieve Goals: Good Pt will go Supine/Side to Sit: with supervision PT Goal: Supine/Side to Sit - Progress: Progressing toward goal Pt will go Sit to Stand: with supervision PT Goal: Sit to Stand - Progress: Progressing toward goal Pt will go Stand to Sit: with supervision PT Goal: Stand to Sit - Progress: Progressing toward goal Pt will Ambulate: 51 - 150 feet;with rolling walker;with supervision PT Goal: Ambulate - Progress: Progressing toward goal Pt will Perform Home Exercise Program: Independently PT Goal: Perform Home Exercise Program - Progress: Goal set today  Visit Information  Last PT Received On: 10/19/11 Assistance Needed: +1    Subjective Data  Subjective: I am not in any pain but feel weak all over. Patient Stated Goal: To d/c to rehab   Cognition  Overall Cognitive Status: Appears within functional limits for tasks assessed/performed Arousal/Alertness: Awake/alert Orientation Level: Appears intact for tasks assessed Behavior During Session: [facility] for tasks performed    Balance  Balance Balance Assessed: No  End of Session PT - End of Session Equipment Utilized During  Treatment: Gait belt;Left knee immobilizer Activity Tolerance: Patient tolerated treatment well Patient left: in chair;with call bell/phone within reach;with  family/visitor present Nurse Communication: Mobility status   GP     Ora Mcnatt 10/19/2011, 8:43 AM

## 2011-10-19 NOTE — Progress Notes (Signed)
Agree with session, d/c recommendations, and added HEP goal.  10/19/2011 Cephus Shelling, PT, DPT [phone]

## 2011-10-19 NOTE — Progress Notes (Signed)
Physical Therapy Progress Note   10/19/11 1200  PT Visit Information  Last PT Received On 10/19/11  Assistance Needed +1  PT Time Calculation  PT Start Time 1210  PT Stop Time 1232  PT Time Calculation (min) 22 min  Subjective Data  Subjective Waiting to hear back from the social worker  Patient Stated Goal To d/c to rehab  Precautions  Precautions Fall;Knee  Required Braces or Orthoses Knee Immobilizer - Left  Knee Immobilizer - Left On when out of bed or walking  Restrictions  Weight Bearing Restrictions Yes  LLE Weight Bearing WBAT  Cognition  Overall Cognitive Status Appears within functional limits for tasks assessed/performed  Arousal/Alertness Awake/alert  Orientation Level Appears intact for tasks assessed  Behavior During Session [facility] for tasks performed  Bed Mobility  Bed Mobility Not assessed  Transfers  Transfers Sit to Stand;Stand to Sit  Sit to Stand 4: Min assist;With upper extremity assist;From bed  Stand to Sit 4: Min assist;To chair/3-in-1  Details for Transfer Assistance (A) with RW placement and balance.  Cues for hand placement and proper technique  Ambulation/Gait  Ambulation/Gait Assistance 4: Min guard  Ambulation Distance (Feet) 90 Feet  Assistive device Rolling walker  Ambulation/Gait Assistance Details Cues for posture and proper step sequence.  Gait Pattern Step-to pattern;Decreased stride length;Antalgic  Stairs No  Engineering geologist No  Balance  Balance Assessed No  PT - End of Session  Equipment Utilized During Treatment Gait belt;Left knee immobilizer  Activity Tolerance Patient tolerated treatment well  Patient left in chair;with call bell/phone within reach;with family/visitor present  Nurse Communication Mobility status  PT - Assessment/Plan  Comments on Treatment Session Pt explained bruising her tail bone during a fall prior to surgery and not being able to shift weight through hips to scoot forward in chair.   Pt increase ambulation distance and gait quality.    PT Plan Discharge plan remains appropriate;Frequency remains appropriate  PT Frequency 7X/week  Recommendations for Other Services Other (comment) (None)  Follow Up Recommendations Post acute inpatient;Supervision/Assistance - 24 hour  Does the patient have the potential to tolerate intense rehabilitation? No, Recommend SNF  Equipment Recommended 3 in 1 bedside comode;Tub/shower bench  Acute Rehab PT Goals  PT Goal Formulation With patient/family  Time For Goal Achievement 10/23/11  Potential to Achieve Goals Good  Pt will go Sit to Stand with supervision  PT Goal: Sit to Stand - Progress Progressing toward goal  Pt will go Stand to Sit with supervision  PT Goal: Stand to Sit - Progress Progressing toward goal  Pt will Ambulate 51 - 150 feet;with rolling walker;with supervision  PT Goal: Ambulate - Progress Met    3/10 pain in L LE.  Kingsly Kloepfer, SPT

## 2011-10-19 NOTE — Clinical Social Work Placement (Addendum)
    Clinical Social Work Department CLINICAL SOCIAL WORK PLACEMENT NOTE 10/19/2011  Patient:  Rachel Green,Rachel Green  Account Number:  [id number] Admit date:  10/15/2011  Clinical Social Worker:  Lupita Leash Tira Lafferty, LCSWA  Date/time:  10/19/2011 01:40 PM  Clinical Social Work is seeking post-discharge placement for this patient at the following level of care:   SKILLED NURSING   (*CSW will update this form in Epic as items are completed)   10/19/2011  Patient/family provided with [facility] Department of Clinical Social Work's list of facilities offering this level of care within the geographic area requested by the patient (or if unable, by the patient's family).  10/19/2011  Patient/family informed of their freedom to choose among providers that offer the needed level of care, that participate in Medicare, Medicaid or managed care program needed by the patient, have an available bed and are willing to accept the patient.  10/19/2011  Patient/family informed of [facility]' ownership interest in [facility], as well as of the fact that they are under no obligation to receive care at this facility.  PASARR submitted to EDS on 10/19/2011 PASARR number received from EDS on 10/19/2011  FL2 transmitted to all facilities in geographic area requested by pt/family on  10/19/2011 FL2 transmitted to all facilities within larger geographic area on   Patient informed that his/her managed care company has contracts with or will negotiate with  certain facilities, including the following:   NA     Patient/family informed of bed offers received:  10/19/11 Patient chooses bed at [facility] Physician recommends and patient chooses bed at    Patient to be transferred to [facility] on 10/19/11  Patient to be transferred to facility by Ambulance  The following physician request were entered in Epic:   Additional Comments: Patient and husband are extremely pleased with  d/c plan. Notified SNF and pt's nurse [place] of d/c today.  No further CSW needs identified.  Lorri Frederick. [place]  [phone]

## 2011-10-19 NOTE — Progress Notes (Signed)
Subjective: 4 Days Post-Op Procedure(s) (LRB): TOTAL KNEE ARTHROPLASTY (Left) Patient reports pain as mild.    Objective: Vital signs in last 24 hours: Temp:  [97.2 F (36.2 C)-98.3 F (36.8 C)] 97.2 F (36.2 C) (10/08 1300) Pulse Rate:  [60-74] 74  (10/08 1300) Resp:  [18] 18  (10/08 1300) BP: (121-134)/(49-65) 133/49 mmHg (10/08 1300) SpO2:  [96 %-100 %] 98 % (10/08 1300)  Intake/Output from previous day: 10/07 0701 - 10/08 0700 In: 590 [P.O.:590] Out: -  Intake/Output this shift: Total I/O In: 240 [P.O.:240] Out: -    Basename 10/18/11 0710 10/17/11 0515  HGB 11.7* 8.8*    Basename 10/18/11 0710 10/17/11 0515  WBC 10.8* 13.1*  RBC 3.70* 2.81*  HCT 34.0* 26.8*  PLT 140* 146*    Basename 10/19/11 0610 10/18/11 0710  NA 136 137  K 3.5 3.5  CL 97 98  CO2 28 29  BUN 11 12  CREATININE 0.93 0.99  GLUCOSE 99 103*  CALCIUM 9.0 8.9   No results found for this basename: LABPT:2,INR:2 in the last 72 hours  Neurovascular intact Dorsiflexion/Plantar flexion intact Incision: dressing C/D/I  Assessment/Plan: 4 Days Post-Op Procedure(s) (LRB): TOTAL KNEE ARTHROPLASTY (Left) Discharge to SNF  Rachel Green 10/19/2011, 1:58 PM

## 2011-10-19 NOTE — Clinical Social Work Psychosocial (Signed)
     Clinical Social Work Department BRIEF PSYCHOSOCIAL ASSESSMENT 10/19/2011  Patient:  Rachel Green,Rachel Green     Account Number:  [id number]     Admit date:  10/15/2011  Clinical Social Worker:  Tiburcio Pea  Date/Time:  10/19/2011 01:35 PM  Referred by:    Date Referred:  10/19/2011 Referred for  SNF Placement   Other Referral:   *  Patient referred by Alferd Apa- Physical Therapy   Interview type:  Other - See comment Other interview type:   Patient and husband    PSYCHOSOCIAL DATA Living Status:  HUSBAND Admitted from facility:   Level of care:   Primary support name:  Jimmy Meritt Primary support relationship to patient:  SPOUSE Degree of support available:   Strong support    CURRENT CONCERNS Current Concerns  Post-Acute Placement   Other Concerns:    SOCIAL WORK ASSESSMENT / PLAN Patient was scheduled to go home yesterday evening but after she met with Physical Therapist- it was agreed that she would seek SNF placement. Patient was scheduled for d/c yesterday per MD.  SNF search begun in [facility]; Fl2 sent to MD's office for review.  Awaiting bed offers. Patient would like [facility] if possible.   Assessment/plan status:  Psychosocial Support/Ongoing Assessment of Needs Other assessment/ plan:   Information/referral to community resources:   SNF list provided    PATIENTS/FAMILYS RESPONSE TO PLAN OF CARE: Patient and husband agree to short term SNF.  Want placement in [facility]. Patient is medically stable for d/c to SNF; awaiting bed offers.

## 2011-10-20 ENCOUNTER — Encounter (HOSPITAL_COMMUNITY): Payer: Self-pay | Admitting: Orthopedic Surgery

## 2011-10-20 DIAGNOSIS — N039 Chronic nephritic syndrome with unspecified morphologic changes: Secondary | ICD-10-CM | POA: Diagnosis not present

## 2011-10-20 DIAGNOSIS — Z96659 Presence of unspecified artificial knee joint: Secondary | ICD-10-CM | POA: Diagnosis not present

## 2011-10-20 DIAGNOSIS — G8918 Other acute postprocedural pain: Secondary | ICD-10-CM | POA: Diagnosis not present

## 2011-10-20 DIAGNOSIS — K219 Gastro-esophageal reflux disease without esophagitis: Secondary | ICD-10-CM | POA: Diagnosis not present

## 2011-10-28 DIAGNOSIS — Z96659 Presence of unspecified artificial knee joint: Secondary | ICD-10-CM | POA: Diagnosis not present

## 2011-10-28 DIAGNOSIS — Z471 Aftercare following joint replacement surgery: Secondary | ICD-10-CM | POA: Diagnosis not present

## 2011-11-07 DIAGNOSIS — Z471 Aftercare following joint replacement surgery: Secondary | ICD-10-CM | POA: Diagnosis not present

## 2011-11-07 DIAGNOSIS — Z96659 Presence of unspecified artificial knee joint: Secondary | ICD-10-CM | POA: Diagnosis not present

## 2011-11-07 DIAGNOSIS — IMO0001 Reserved for inherently not codable concepts without codable children: Secondary | ICD-10-CM | POA: Diagnosis not present

## 2011-11-07 DIAGNOSIS — J449 Chronic obstructive pulmonary disease, unspecified: Secondary | ICD-10-CM | POA: Diagnosis not present

## 2011-11-07 DIAGNOSIS — M1710 Unilateral primary osteoarthritis, unspecified knee: Secondary | ICD-10-CM | POA: Diagnosis not present

## 2011-11-07 DIAGNOSIS — I10 Essential (primary) hypertension: Secondary | ICD-10-CM | POA: Diagnosis not present

## 2011-11-08 DIAGNOSIS — I10 Essential (primary) hypertension: Secondary | ICD-10-CM | POA: Diagnosis not present

## 2011-11-08 DIAGNOSIS — Z96659 Presence of unspecified artificial knee joint: Secondary | ICD-10-CM | POA: Diagnosis not present

## 2011-11-08 DIAGNOSIS — J449 Chronic obstructive pulmonary disease, unspecified: Secondary | ICD-10-CM | POA: Diagnosis not present

## 2011-11-08 DIAGNOSIS — Z471 Aftercare following joint replacement surgery: Secondary | ICD-10-CM | POA: Diagnosis not present

## 2011-11-08 DIAGNOSIS — IMO0001 Reserved for inherently not codable concepts without codable children: Secondary | ICD-10-CM | POA: Diagnosis not present

## 2011-11-09 DIAGNOSIS — Z96659 Presence of unspecified artificial knee joint: Secondary | ICD-10-CM | POA: Diagnosis not present

## 2011-11-09 DIAGNOSIS — Z471 Aftercare following joint replacement surgery: Secondary | ICD-10-CM | POA: Diagnosis not present

## 2011-11-09 DIAGNOSIS — IMO0001 Reserved for inherently not codable concepts without codable children: Secondary | ICD-10-CM | POA: Diagnosis not present

## 2011-11-09 DIAGNOSIS — I10 Essential (primary) hypertension: Secondary | ICD-10-CM | POA: Diagnosis not present

## 2011-11-09 DIAGNOSIS — J449 Chronic obstructive pulmonary disease, unspecified: Secondary | ICD-10-CM | POA: Diagnosis not present

## 2011-11-10 DIAGNOSIS — IMO0001 Reserved for inherently not codable concepts without codable children: Secondary | ICD-10-CM | POA: Diagnosis not present

## 2011-11-10 DIAGNOSIS — Z96659 Presence of unspecified artificial knee joint: Secondary | ICD-10-CM | POA: Diagnosis not present

## 2011-11-10 DIAGNOSIS — I10 Essential (primary) hypertension: Secondary | ICD-10-CM | POA: Diagnosis not present

## 2011-11-10 DIAGNOSIS — Z471 Aftercare following joint replacement surgery: Secondary | ICD-10-CM | POA: Diagnosis not present

## 2011-11-10 DIAGNOSIS — J449 Chronic obstructive pulmonary disease, unspecified: Secondary | ICD-10-CM | POA: Diagnosis not present

## 2011-11-11 DIAGNOSIS — I10 Essential (primary) hypertension: Secondary | ICD-10-CM | POA: Diagnosis not present

## 2011-11-11 DIAGNOSIS — Z471 Aftercare following joint replacement surgery: Secondary | ICD-10-CM | POA: Diagnosis not present

## 2011-11-11 DIAGNOSIS — J449 Chronic obstructive pulmonary disease, unspecified: Secondary | ICD-10-CM | POA: Diagnosis not present

## 2011-11-11 DIAGNOSIS — IMO0001 Reserved for inherently not codable concepts without codable children: Secondary | ICD-10-CM | POA: Diagnosis not present

## 2011-11-11 DIAGNOSIS — Z96659 Presence of unspecified artificial knee joint: Secondary | ICD-10-CM | POA: Diagnosis not present

## 2011-11-12 ENCOUNTER — Other Ambulatory Visit: Payer: Self-pay | Admitting: Internal Medicine

## 2011-11-12 DIAGNOSIS — I10 Essential (primary) hypertension: Secondary | ICD-10-CM | POA: Diagnosis not present

## 2011-11-12 DIAGNOSIS — IMO0001 Reserved for inherently not codable concepts without codable children: Secondary | ICD-10-CM | POA: Diagnosis not present

## 2011-11-12 DIAGNOSIS — J449 Chronic obstructive pulmonary disease, unspecified: Secondary | ICD-10-CM | POA: Diagnosis not present

## 2011-11-12 DIAGNOSIS — Z471 Aftercare following joint replacement surgery: Secondary | ICD-10-CM | POA: Diagnosis not present

## 2011-11-12 DIAGNOSIS — Z96659 Presence of unspecified artificial knee joint: Secondary | ICD-10-CM | POA: Diagnosis not present

## 2011-11-15 DIAGNOSIS — I10 Essential (primary) hypertension: Secondary | ICD-10-CM | POA: Diagnosis not present

## 2011-11-15 DIAGNOSIS — Z96659 Presence of unspecified artificial knee joint: Secondary | ICD-10-CM | POA: Diagnosis not present

## 2011-11-15 DIAGNOSIS — Z471 Aftercare following joint replacement surgery: Secondary | ICD-10-CM | POA: Diagnosis not present

## 2011-11-15 DIAGNOSIS — IMO0001 Reserved for inherently not codable concepts without codable children: Secondary | ICD-10-CM | POA: Diagnosis not present

## 2011-11-15 DIAGNOSIS — J449 Chronic obstructive pulmonary disease, unspecified: Secondary | ICD-10-CM | POA: Diagnosis not present

## 2011-11-16 DIAGNOSIS — IMO0001 Reserved for inherently not codable concepts without codable children: Secondary | ICD-10-CM | POA: Diagnosis not present

## 2011-11-16 DIAGNOSIS — Z96659 Presence of unspecified artificial knee joint: Secondary | ICD-10-CM | POA: Diagnosis not present

## 2011-11-16 DIAGNOSIS — Z471 Aftercare following joint replacement surgery: Secondary | ICD-10-CM | POA: Diagnosis not present

## 2011-11-16 DIAGNOSIS — J449 Chronic obstructive pulmonary disease, unspecified: Secondary | ICD-10-CM | POA: Diagnosis not present

## 2011-11-16 DIAGNOSIS — I10 Essential (primary) hypertension: Secondary | ICD-10-CM | POA: Diagnosis not present

## 2011-11-17 DIAGNOSIS — I10 Essential (primary) hypertension: Secondary | ICD-10-CM | POA: Diagnosis not present

## 2011-11-17 DIAGNOSIS — Z471 Aftercare following joint replacement surgery: Secondary | ICD-10-CM | POA: Diagnosis not present

## 2011-11-17 DIAGNOSIS — IMO0001 Reserved for inherently not codable concepts without codable children: Secondary | ICD-10-CM | POA: Diagnosis not present

## 2011-11-17 DIAGNOSIS — Z96659 Presence of unspecified artificial knee joint: Secondary | ICD-10-CM | POA: Diagnosis not present

## 2011-11-17 DIAGNOSIS — J449 Chronic obstructive pulmonary disease, unspecified: Secondary | ICD-10-CM | POA: Diagnosis not present

## 2011-11-18 DIAGNOSIS — IMO0001 Reserved for inherently not codable concepts without codable children: Secondary | ICD-10-CM | POA: Diagnosis not present

## 2011-11-18 DIAGNOSIS — Z96659 Presence of unspecified artificial knee joint: Secondary | ICD-10-CM | POA: Diagnosis not present

## 2011-11-18 DIAGNOSIS — Z471 Aftercare following joint replacement surgery: Secondary | ICD-10-CM | POA: Diagnosis not present

## 2011-11-18 DIAGNOSIS — J449 Chronic obstructive pulmonary disease, unspecified: Secondary | ICD-10-CM | POA: Diagnosis not present

## 2011-11-18 DIAGNOSIS — I10 Essential (primary) hypertension: Secondary | ICD-10-CM | POA: Diagnosis not present

## 2011-11-19 DIAGNOSIS — I10 Essential (primary) hypertension: Secondary | ICD-10-CM | POA: Diagnosis not present

## 2011-11-19 DIAGNOSIS — Z96659 Presence of unspecified artificial knee joint: Secondary | ICD-10-CM | POA: Diagnosis not present

## 2011-11-19 DIAGNOSIS — Z6828 Body mass index (BMI) 28.0-28.9, adult: Secondary | ICD-10-CM | POA: Diagnosis not present

## 2011-11-19 DIAGNOSIS — IMO0001 Reserved for inherently not codable concepts without codable children: Secondary | ICD-10-CM | POA: Diagnosis not present

## 2011-11-19 DIAGNOSIS — Z471 Aftercare following joint replacement surgery: Secondary | ICD-10-CM | POA: Diagnosis not present

## 2011-11-19 DIAGNOSIS — E042 Nontoxic multinodular goiter: Secondary | ICD-10-CM | POA: Diagnosis not present

## 2011-11-19 DIAGNOSIS — J449 Chronic obstructive pulmonary disease, unspecified: Secondary | ICD-10-CM | POA: Diagnosis not present

## 2011-11-19 DIAGNOSIS — R609 Edema, unspecified: Secondary | ICD-10-CM | POA: Diagnosis not present

## 2011-11-22 DIAGNOSIS — I10 Essential (primary) hypertension: Secondary | ICD-10-CM | POA: Diagnosis not present

## 2011-11-22 DIAGNOSIS — J449 Chronic obstructive pulmonary disease, unspecified: Secondary | ICD-10-CM | POA: Diagnosis not present

## 2011-11-22 DIAGNOSIS — Z96659 Presence of unspecified artificial knee joint: Secondary | ICD-10-CM | POA: Diagnosis not present

## 2011-11-22 DIAGNOSIS — Z471 Aftercare following joint replacement surgery: Secondary | ICD-10-CM | POA: Diagnosis not present

## 2011-11-22 DIAGNOSIS — IMO0001 Reserved for inherently not codable concepts without codable children: Secondary | ICD-10-CM | POA: Diagnosis not present

## 2011-11-25 DIAGNOSIS — Z471 Aftercare following joint replacement surgery: Secondary | ICD-10-CM | POA: Diagnosis not present

## 2011-11-25 DIAGNOSIS — Z96659 Presence of unspecified artificial knee joint: Secondary | ICD-10-CM | POA: Diagnosis not present

## 2011-11-25 DIAGNOSIS — I10 Essential (primary) hypertension: Secondary | ICD-10-CM | POA: Diagnosis not present

## 2011-11-25 DIAGNOSIS — J449 Chronic obstructive pulmonary disease, unspecified: Secondary | ICD-10-CM | POA: Diagnosis not present

## 2011-11-25 DIAGNOSIS — IMO0001 Reserved for inherently not codable concepts without codable children: Secondary | ICD-10-CM | POA: Diagnosis not present

## 2011-11-26 DIAGNOSIS — Z96659 Presence of unspecified artificial knee joint: Secondary | ICD-10-CM | POA: Diagnosis not present

## 2011-11-26 DIAGNOSIS — Z471 Aftercare following joint replacement surgery: Secondary | ICD-10-CM | POA: Diagnosis not present

## 2011-11-26 DIAGNOSIS — J449 Chronic obstructive pulmonary disease, unspecified: Secondary | ICD-10-CM | POA: Diagnosis not present

## 2011-11-26 DIAGNOSIS — I10 Essential (primary) hypertension: Secondary | ICD-10-CM | POA: Diagnosis not present

## 2011-11-26 DIAGNOSIS — N184 Chronic kidney disease, stage 4 (severe): Secondary | ICD-10-CM | POA: Diagnosis not present

## 2011-11-26 DIAGNOSIS — IMO0001 Reserved for inherently not codable concepts without codable children: Secondary | ICD-10-CM | POA: Diagnosis not present

## 2011-11-29 DIAGNOSIS — G603 Idiopathic progressive neuropathy: Secondary | ICD-10-CM | POA: Diagnosis not present

## 2011-11-29 DIAGNOSIS — G250 Essential tremor: Secondary | ICD-10-CM | POA: Diagnosis not present

## 2011-11-29 DIAGNOSIS — G252 Other specified forms of tremor: Secondary | ICD-10-CM | POA: Diagnosis not present

## 2011-11-30 DIAGNOSIS — M25669 Stiffness of unspecified knee, not elsewhere classified: Secondary | ICD-10-CM | POA: Diagnosis not present

## 2011-11-30 DIAGNOSIS — R609 Edema, unspecified: Secondary | ICD-10-CM | POA: Diagnosis not present

## 2011-11-30 DIAGNOSIS — Z471 Aftercare following joint replacement surgery: Secondary | ICD-10-CM | POA: Diagnosis not present

## 2011-11-30 DIAGNOSIS — M1990 Unspecified osteoarthritis, unspecified site: Secondary | ICD-10-CM | POA: Diagnosis not present

## 2011-11-30 DIAGNOSIS — R209 Unspecified disturbances of skin sensation: Secondary | ICD-10-CM | POA: Diagnosis not present

## 2011-11-30 DIAGNOSIS — IMO0001 Reserved for inherently not codable concepts without codable children: Secondary | ICD-10-CM | POA: Diagnosis not present

## 2011-11-30 DIAGNOSIS — R293 Abnormal posture: Secondary | ICD-10-CM | POA: Diagnosis not present

## 2011-11-30 DIAGNOSIS — M25569 Pain in unspecified knee: Secondary | ICD-10-CM | POA: Diagnosis not present

## 2011-11-30 DIAGNOSIS — R279 Unspecified lack of coordination: Secondary | ICD-10-CM | POA: Diagnosis not present

## 2011-11-30 DIAGNOSIS — R269 Unspecified abnormalities of gait and mobility: Secondary | ICD-10-CM | POA: Diagnosis not present

## 2011-11-30 DIAGNOSIS — M6281 Muscle weakness (generalized): Secondary | ICD-10-CM | POA: Diagnosis not present

## 2011-11-30 DIAGNOSIS — Z96659 Presence of unspecified artificial knee joint: Secondary | ICD-10-CM | POA: Diagnosis not present

## 2011-12-02 DIAGNOSIS — M25669 Stiffness of unspecified knee, not elsewhere classified: Secondary | ICD-10-CM | POA: Diagnosis not present

## 2011-12-02 DIAGNOSIS — IMO0001 Reserved for inherently not codable concepts without codable children: Secondary | ICD-10-CM | POA: Diagnosis not present

## 2011-12-02 DIAGNOSIS — R279 Unspecified lack of coordination: Secondary | ICD-10-CM | POA: Diagnosis not present

## 2011-12-02 DIAGNOSIS — M6281 Muscle weakness (generalized): Secondary | ICD-10-CM | POA: Diagnosis not present

## 2011-12-02 DIAGNOSIS — M999 Biomechanical lesion, unspecified: Secondary | ICD-10-CM | POA: Diagnosis not present

## 2011-12-02 DIAGNOSIS — R293 Abnormal posture: Secondary | ICD-10-CM | POA: Diagnosis not present

## 2011-12-02 DIAGNOSIS — M25569 Pain in unspecified knee: Secondary | ICD-10-CM | POA: Diagnosis not present

## 2011-12-03 DIAGNOSIS — I10 Essential (primary) hypertension: Secondary | ICD-10-CM | POA: Diagnosis not present

## 2011-12-04 DIAGNOSIS — R5383 Other fatigue: Secondary | ICD-10-CM | POA: Diagnosis not present

## 2011-12-04 DIAGNOSIS — T7840XA Allergy, unspecified, initial encounter: Secondary | ICD-10-CM | POA: Diagnosis not present

## 2011-12-04 DIAGNOSIS — R5381 Other malaise: Secondary | ICD-10-CM | POA: Diagnosis not present

## 2011-12-07 DIAGNOSIS — M6281 Muscle weakness (generalized): Secondary | ICD-10-CM | POA: Diagnosis not present

## 2011-12-07 DIAGNOSIS — M25569 Pain in unspecified knee: Secondary | ICD-10-CM | POA: Diagnosis not present

## 2011-12-07 DIAGNOSIS — R279 Unspecified lack of coordination: Secondary | ICD-10-CM | POA: Diagnosis not present

## 2011-12-07 DIAGNOSIS — R293 Abnormal posture: Secondary | ICD-10-CM | POA: Diagnosis not present

## 2011-12-07 DIAGNOSIS — IMO0001 Reserved for inherently not codable concepts without codable children: Secondary | ICD-10-CM | POA: Diagnosis not present

## 2011-12-07 DIAGNOSIS — M25669 Stiffness of unspecified knee, not elsewhere classified: Secondary | ICD-10-CM | POA: Diagnosis not present

## 2011-12-10 DIAGNOSIS — IMO0001 Reserved for inherently not codable concepts without codable children: Secondary | ICD-10-CM | POA: Diagnosis not present

## 2011-12-10 DIAGNOSIS — R279 Unspecified lack of coordination: Secondary | ICD-10-CM | POA: Diagnosis not present

## 2011-12-10 DIAGNOSIS — M25669 Stiffness of unspecified knee, not elsewhere classified: Secondary | ICD-10-CM | POA: Diagnosis not present

## 2011-12-10 DIAGNOSIS — M25569 Pain in unspecified knee: Secondary | ICD-10-CM | POA: Diagnosis not present

## 2011-12-10 DIAGNOSIS — M6281 Muscle weakness (generalized): Secondary | ICD-10-CM | POA: Diagnosis not present

## 2011-12-10 DIAGNOSIS — R293 Abnormal posture: Secondary | ICD-10-CM | POA: Diagnosis not present

## 2011-12-13 DIAGNOSIS — R209 Unspecified disturbances of skin sensation: Secondary | ICD-10-CM | POA: Diagnosis not present

## 2011-12-13 DIAGNOSIS — R293 Abnormal posture: Secondary | ICD-10-CM | POA: Diagnosis not present

## 2011-12-13 DIAGNOSIS — Z96659 Presence of unspecified artificial knee joint: Secondary | ICD-10-CM | POA: Diagnosis not present

## 2011-12-13 DIAGNOSIS — IMO0001 Reserved for inherently not codable concepts without codable children: Secondary | ICD-10-CM | POA: Diagnosis not present

## 2011-12-13 DIAGNOSIS — R609 Edema, unspecified: Secondary | ICD-10-CM | POA: Diagnosis not present

## 2011-12-13 DIAGNOSIS — M6281 Muscle weakness (generalized): Secondary | ICD-10-CM | POA: Diagnosis not present

## 2011-12-13 DIAGNOSIS — S7000XA Contusion of unspecified hip, initial encounter: Secondary | ICD-10-CM | POA: Diagnosis not present

## 2011-12-13 DIAGNOSIS — M25669 Stiffness of unspecified knee, not elsewhere classified: Secondary | ICD-10-CM | POA: Diagnosis not present

## 2011-12-13 DIAGNOSIS — R279 Unspecified lack of coordination: Secondary | ICD-10-CM | POA: Diagnosis not present

## 2011-12-13 DIAGNOSIS — M1990 Unspecified osteoarthritis, unspecified site: Secondary | ICD-10-CM | POA: Diagnosis not present

## 2011-12-13 DIAGNOSIS — Z471 Aftercare following joint replacement surgery: Secondary | ICD-10-CM | POA: Diagnosis not present

## 2011-12-13 DIAGNOSIS — R269 Unspecified abnormalities of gait and mobility: Secondary | ICD-10-CM | POA: Diagnosis not present

## 2011-12-13 DIAGNOSIS — M25569 Pain in unspecified knee: Secondary | ICD-10-CM | POA: Diagnosis not present

## 2011-12-14 DIAGNOSIS — N8111 Cystocele, midline: Secondary | ICD-10-CM | POA: Diagnosis not present

## 2011-12-14 DIAGNOSIS — R339 Retention of urine, unspecified: Secondary | ICD-10-CM | POA: Diagnosis not present

## 2011-12-14 DIAGNOSIS — N3941 Urge incontinence: Secondary | ICD-10-CM | POA: Diagnosis not present

## 2011-12-14 DIAGNOSIS — R350 Frequency of micturition: Secondary | ICD-10-CM | POA: Diagnosis not present

## 2011-12-14 DIAGNOSIS — N318 Other neuromuscular dysfunction of bladder: Secondary | ICD-10-CM | POA: Diagnosis not present

## 2011-12-14 DIAGNOSIS — R3915 Urgency of urination: Secondary | ICD-10-CM | POA: Diagnosis not present

## 2011-12-15 DIAGNOSIS — R5381 Other malaise: Secondary | ICD-10-CM | POA: Diagnosis not present

## 2011-12-15 DIAGNOSIS — E042 Nontoxic multinodular goiter: Secondary | ICD-10-CM | POA: Diagnosis not present

## 2011-12-15 DIAGNOSIS — E876 Hypokalemia: Secondary | ICD-10-CM | POA: Diagnosis not present

## 2011-12-15 DIAGNOSIS — Z6827 Body mass index (BMI) 27.0-27.9, adult: Secondary | ICD-10-CM | POA: Diagnosis not present

## 2011-12-15 DIAGNOSIS — H00039 Abscess of eyelid unspecified eye, unspecified eyelid: Secondary | ICD-10-CM | POA: Diagnosis not present

## 2011-12-15 DIAGNOSIS — R5383 Other fatigue: Secondary | ICD-10-CM | POA: Diagnosis not present

## 2011-12-20 DIAGNOSIS — H04129 Dry eye syndrome of unspecified lacrimal gland: Secondary | ICD-10-CM | POA: Diagnosis not present

## 2011-12-20 DIAGNOSIS — Z961 Presence of intraocular lens: Secondary | ICD-10-CM | POA: Diagnosis not present

## 2011-12-21 DIAGNOSIS — M25669 Stiffness of unspecified knee, not elsewhere classified: Secondary | ICD-10-CM | POA: Diagnosis not present

## 2011-12-21 DIAGNOSIS — R279 Unspecified lack of coordination: Secondary | ICD-10-CM | POA: Diagnosis not present

## 2011-12-21 DIAGNOSIS — M6281 Muscle weakness (generalized): Secondary | ICD-10-CM | POA: Diagnosis not present

## 2011-12-21 DIAGNOSIS — IMO0001 Reserved for inherently not codable concepts without codable children: Secondary | ICD-10-CM | POA: Diagnosis not present

## 2011-12-21 DIAGNOSIS — R293 Abnormal posture: Secondary | ICD-10-CM | POA: Diagnosis not present

## 2011-12-21 DIAGNOSIS — M25569 Pain in unspecified knee: Secondary | ICD-10-CM | POA: Diagnosis not present

## 2011-12-23 DIAGNOSIS — Z96659 Presence of unspecified artificial knee joint: Secondary | ICD-10-CM | POA: Diagnosis not present

## 2011-12-23 DIAGNOSIS — Z471 Aftercare following joint replacement surgery: Secondary | ICD-10-CM | POA: Diagnosis not present

## 2011-12-28 DIAGNOSIS — M25569 Pain in unspecified knee: Secondary | ICD-10-CM | POA: Diagnosis not present

## 2011-12-28 DIAGNOSIS — M6281 Muscle weakness (generalized): Secondary | ICD-10-CM | POA: Diagnosis not present

## 2011-12-28 DIAGNOSIS — M25669 Stiffness of unspecified knee, not elsewhere classified: Secondary | ICD-10-CM | POA: Diagnosis not present

## 2011-12-28 DIAGNOSIS — IMO0001 Reserved for inherently not codable concepts without codable children: Secondary | ICD-10-CM | POA: Diagnosis not present

## 2011-12-28 DIAGNOSIS — R293 Abnormal posture: Secondary | ICD-10-CM | POA: Diagnosis not present

## 2011-12-28 DIAGNOSIS — R279 Unspecified lack of coordination: Secondary | ICD-10-CM | POA: Diagnosis not present

## 2011-12-29 DIAGNOSIS — R279 Unspecified lack of coordination: Secondary | ICD-10-CM | POA: Diagnosis not present

## 2011-12-29 DIAGNOSIS — M25669 Stiffness of unspecified knee, not elsewhere classified: Secondary | ICD-10-CM | POA: Diagnosis not present

## 2011-12-29 DIAGNOSIS — R269 Unspecified abnormalities of gait and mobility: Secondary | ICD-10-CM | POA: Diagnosis not present

## 2011-12-29 DIAGNOSIS — M25569 Pain in unspecified knee: Secondary | ICD-10-CM | POA: Diagnosis not present

## 2011-12-29 DIAGNOSIS — R293 Abnormal posture: Secondary | ICD-10-CM | POA: Diagnosis not present

## 2011-12-29 DIAGNOSIS — Z6826 Body mass index (BMI) 26.0-26.9, adult: Secondary | ICD-10-CM | POA: Diagnosis not present

## 2011-12-29 DIAGNOSIS — IMO0001 Reserved for inherently not codable concepts without codable children: Secondary | ICD-10-CM | POA: Diagnosis not present

## 2011-12-29 DIAGNOSIS — T148XXA Other injury of unspecified body region, initial encounter: Secondary | ICD-10-CM | POA: Diagnosis not present

## 2011-12-29 DIAGNOSIS — R209 Unspecified disturbances of skin sensation: Secondary | ICD-10-CM | POA: Diagnosis not present

## 2011-12-29 DIAGNOSIS — M6281 Muscle weakness (generalized): Secondary | ICD-10-CM | POA: Diagnosis not present

## 2011-12-29 DIAGNOSIS — S7000XA Contusion of unspecified hip, initial encounter: Secondary | ICD-10-CM | POA: Diagnosis not present

## 2011-12-30 DIAGNOSIS — R293 Abnormal posture: Secondary | ICD-10-CM | POA: Diagnosis not present

## 2011-12-30 DIAGNOSIS — IMO0001 Reserved for inherently not codable concepts without codable children: Secondary | ICD-10-CM | POA: Diagnosis not present

## 2011-12-30 DIAGNOSIS — R279 Unspecified lack of coordination: Secondary | ICD-10-CM | POA: Diagnosis not present

## 2011-12-30 DIAGNOSIS — M6281 Muscle weakness (generalized): Secondary | ICD-10-CM | POA: Diagnosis not present

## 2011-12-30 DIAGNOSIS — M25669 Stiffness of unspecified knee, not elsewhere classified: Secondary | ICD-10-CM | POA: Diagnosis not present

## 2011-12-30 DIAGNOSIS — M25569 Pain in unspecified knee: Secondary | ICD-10-CM | POA: Diagnosis not present

## 2012-01-07 DIAGNOSIS — M6281 Muscle weakness (generalized): Secondary | ICD-10-CM | POA: Diagnosis not present

## 2012-01-07 DIAGNOSIS — R279 Unspecified lack of coordination: Secondary | ICD-10-CM | POA: Diagnosis not present

## 2012-01-07 DIAGNOSIS — R293 Abnormal posture: Secondary | ICD-10-CM | POA: Diagnosis not present

## 2012-01-07 DIAGNOSIS — IMO0001 Reserved for inherently not codable concepts without codable children: Secondary | ICD-10-CM | POA: Diagnosis not present

## 2012-01-07 DIAGNOSIS — M25569 Pain in unspecified knee: Secondary | ICD-10-CM | POA: Diagnosis not present

## 2012-01-07 DIAGNOSIS — M25669 Stiffness of unspecified knee, not elsewhere classified: Secondary | ICD-10-CM | POA: Diagnosis not present

## 2012-01-11 DIAGNOSIS — R279 Unspecified lack of coordination: Secondary | ICD-10-CM | POA: Diagnosis not present

## 2012-01-11 DIAGNOSIS — M25669 Stiffness of unspecified knee, not elsewhere classified: Secondary | ICD-10-CM | POA: Diagnosis not present

## 2012-01-11 DIAGNOSIS — M25569 Pain in unspecified knee: Secondary | ICD-10-CM | POA: Diagnosis not present

## 2012-01-11 DIAGNOSIS — R293 Abnormal posture: Secondary | ICD-10-CM | POA: Diagnosis not present

## 2012-01-11 DIAGNOSIS — IMO0001 Reserved for inherently not codable concepts without codable children: Secondary | ICD-10-CM | POA: Diagnosis not present

## 2012-01-11 DIAGNOSIS — M6281 Muscle weakness (generalized): Secondary | ICD-10-CM | POA: Diagnosis not present

## 2012-01-13 DIAGNOSIS — M1990 Unspecified osteoarthritis, unspecified site: Secondary | ICD-10-CM | POA: Diagnosis not present

## 2012-01-13 DIAGNOSIS — Z471 Aftercare following joint replacement surgery: Secondary | ICD-10-CM | POA: Diagnosis not present

## 2012-01-13 DIAGNOSIS — M6281 Muscle weakness (generalized): Secondary | ICD-10-CM | POA: Diagnosis not present

## 2012-01-13 DIAGNOSIS — R209 Unspecified disturbances of skin sensation: Secondary | ICD-10-CM | POA: Diagnosis not present

## 2012-01-13 DIAGNOSIS — M25669 Stiffness of unspecified knee, not elsewhere classified: Secondary | ICD-10-CM | POA: Diagnosis not present

## 2012-01-13 DIAGNOSIS — Z96659 Presence of unspecified artificial knee joint: Secondary | ICD-10-CM | POA: Diagnosis not present

## 2012-01-13 DIAGNOSIS — R609 Edema, unspecified: Secondary | ICD-10-CM | POA: Diagnosis not present

## 2012-01-13 DIAGNOSIS — R293 Abnormal posture: Secondary | ICD-10-CM | POA: Diagnosis not present

## 2012-01-13 DIAGNOSIS — IMO0001 Reserved for inherently not codable concepts without codable children: Secondary | ICD-10-CM | POA: Diagnosis not present

## 2012-01-13 DIAGNOSIS — R279 Unspecified lack of coordination: Secondary | ICD-10-CM | POA: Diagnosis not present

## 2012-01-13 DIAGNOSIS — M25569 Pain in unspecified knee: Secondary | ICD-10-CM | POA: Diagnosis not present

## 2012-01-13 DIAGNOSIS — R269 Unspecified abnormalities of gait and mobility: Secondary | ICD-10-CM | POA: Diagnosis not present

## 2012-01-17 DIAGNOSIS — M6281 Muscle weakness (generalized): Secondary | ICD-10-CM | POA: Diagnosis not present

## 2012-01-17 DIAGNOSIS — R279 Unspecified lack of coordination: Secondary | ICD-10-CM | POA: Diagnosis not present

## 2012-01-17 DIAGNOSIS — R293 Abnormal posture: Secondary | ICD-10-CM | POA: Diagnosis not present

## 2012-01-17 DIAGNOSIS — IMO0001 Reserved for inherently not codable concepts without codable children: Secondary | ICD-10-CM | POA: Diagnosis not present

## 2012-01-17 DIAGNOSIS — E042 Nontoxic multinodular goiter: Secondary | ICD-10-CM | POA: Diagnosis not present

## 2012-01-17 DIAGNOSIS — M25569 Pain in unspecified knee: Secondary | ICD-10-CM | POA: Diagnosis not present

## 2012-01-17 DIAGNOSIS — M25669 Stiffness of unspecified knee, not elsewhere classified: Secondary | ICD-10-CM | POA: Diagnosis not present

## 2012-01-19 DIAGNOSIS — R279 Unspecified lack of coordination: Secondary | ICD-10-CM | POA: Diagnosis not present

## 2012-01-19 DIAGNOSIS — M6281 Muscle weakness (generalized): Secondary | ICD-10-CM | POA: Diagnosis not present

## 2012-01-19 DIAGNOSIS — M25669 Stiffness of unspecified knee, not elsewhere classified: Secondary | ICD-10-CM | POA: Diagnosis not present

## 2012-01-19 DIAGNOSIS — IMO0001 Reserved for inherently not codable concepts without codable children: Secondary | ICD-10-CM | POA: Diagnosis not present

## 2012-01-19 DIAGNOSIS — R293 Abnormal posture: Secondary | ICD-10-CM | POA: Diagnosis not present

## 2012-01-19 DIAGNOSIS — M25569 Pain in unspecified knee: Secondary | ICD-10-CM | POA: Diagnosis not present

## 2012-01-21 DIAGNOSIS — G250 Essential tremor: Secondary | ICD-10-CM | POA: Diagnosis not present

## 2012-01-21 DIAGNOSIS — G603 Idiopathic progressive neuropathy: Secondary | ICD-10-CM | POA: Diagnosis not present

## 2012-01-21 DIAGNOSIS — G252 Other specified forms of tremor: Secondary | ICD-10-CM | POA: Diagnosis not present

## 2012-01-24 DIAGNOSIS — M6281 Muscle weakness (generalized): Secondary | ICD-10-CM | POA: Diagnosis not present

## 2012-01-24 DIAGNOSIS — R279 Unspecified lack of coordination: Secondary | ICD-10-CM | POA: Diagnosis not present

## 2012-01-24 DIAGNOSIS — M25669 Stiffness of unspecified knee, not elsewhere classified: Secondary | ICD-10-CM | POA: Diagnosis not present

## 2012-01-24 DIAGNOSIS — IMO0001 Reserved for inherently not codable concepts without codable children: Secondary | ICD-10-CM | POA: Diagnosis not present

## 2012-01-24 DIAGNOSIS — R293 Abnormal posture: Secondary | ICD-10-CM | POA: Diagnosis not present

## 2012-01-24 DIAGNOSIS — M25569 Pain in unspecified knee: Secondary | ICD-10-CM | POA: Diagnosis not present

## 2012-01-26 DIAGNOSIS — M999 Biomechanical lesion, unspecified: Secondary | ICD-10-CM | POA: Diagnosis not present

## 2012-01-27 DIAGNOSIS — M25669 Stiffness of unspecified knee, not elsewhere classified: Secondary | ICD-10-CM | POA: Diagnosis not present

## 2012-01-27 DIAGNOSIS — M6281 Muscle weakness (generalized): Secondary | ICD-10-CM | POA: Diagnosis not present

## 2012-01-27 DIAGNOSIS — M25569 Pain in unspecified knee: Secondary | ICD-10-CM | POA: Diagnosis not present

## 2012-01-27 DIAGNOSIS — IMO0001 Reserved for inherently not codable concepts without codable children: Secondary | ICD-10-CM | POA: Diagnosis not present

## 2012-01-27 DIAGNOSIS — R293 Abnormal posture: Secondary | ICD-10-CM | POA: Diagnosis not present

## 2012-01-27 DIAGNOSIS — R279 Unspecified lack of coordination: Secondary | ICD-10-CM | POA: Diagnosis not present

## 2012-01-28 DIAGNOSIS — R3915 Urgency of urination: Secondary | ICD-10-CM | POA: Diagnosis not present

## 2012-01-28 DIAGNOSIS — N318 Other neuromuscular dysfunction of bladder: Secondary | ICD-10-CM | POA: Diagnosis not present

## 2012-01-28 DIAGNOSIS — N3941 Urge incontinence: Secondary | ICD-10-CM | POA: Diagnosis not present

## 2012-01-28 DIAGNOSIS — R339 Retention of urine, unspecified: Secondary | ICD-10-CM | POA: Diagnosis not present

## 2012-01-28 DIAGNOSIS — R350 Frequency of micturition: Secondary | ICD-10-CM | POA: Diagnosis not present

## 2012-02-01 DIAGNOSIS — M25669 Stiffness of unspecified knee, not elsewhere classified: Secondary | ICD-10-CM | POA: Diagnosis not present

## 2012-02-01 DIAGNOSIS — R293 Abnormal posture: Secondary | ICD-10-CM | POA: Diagnosis not present

## 2012-02-01 DIAGNOSIS — M25569 Pain in unspecified knee: Secondary | ICD-10-CM | POA: Diagnosis not present

## 2012-02-01 DIAGNOSIS — R279 Unspecified lack of coordination: Secondary | ICD-10-CM | POA: Diagnosis not present

## 2012-02-01 DIAGNOSIS — IMO0001 Reserved for inherently not codable concepts without codable children: Secondary | ICD-10-CM | POA: Diagnosis not present

## 2012-02-01 DIAGNOSIS — M6281 Muscle weakness (generalized): Secondary | ICD-10-CM | POA: Diagnosis not present

## 2012-02-03 DIAGNOSIS — M25569 Pain in unspecified knee: Secondary | ICD-10-CM | POA: Diagnosis not present

## 2012-02-04 DIAGNOSIS — R293 Abnormal posture: Secondary | ICD-10-CM | POA: Diagnosis not present

## 2012-02-04 DIAGNOSIS — M6281 Muscle weakness (generalized): Secondary | ICD-10-CM | POA: Diagnosis not present

## 2012-02-04 DIAGNOSIS — M25569 Pain in unspecified knee: Secondary | ICD-10-CM | POA: Diagnosis not present

## 2012-02-04 DIAGNOSIS — IMO0001 Reserved for inherently not codable concepts without codable children: Secondary | ICD-10-CM | POA: Diagnosis not present

## 2012-02-04 DIAGNOSIS — R279 Unspecified lack of coordination: Secondary | ICD-10-CM | POA: Diagnosis not present

## 2012-02-04 DIAGNOSIS — M25669 Stiffness of unspecified knee, not elsewhere classified: Secondary | ICD-10-CM | POA: Diagnosis not present

## 2012-02-07 DIAGNOSIS — R279 Unspecified lack of coordination: Secondary | ICD-10-CM | POA: Diagnosis not present

## 2012-02-07 DIAGNOSIS — IMO0001 Reserved for inherently not codable concepts without codable children: Secondary | ICD-10-CM | POA: Diagnosis not present

## 2012-02-07 DIAGNOSIS — M25669 Stiffness of unspecified knee, not elsewhere classified: Secondary | ICD-10-CM | POA: Diagnosis not present

## 2012-02-07 DIAGNOSIS — M25569 Pain in unspecified knee: Secondary | ICD-10-CM | POA: Diagnosis not present

## 2012-02-07 DIAGNOSIS — R293 Abnormal posture: Secondary | ICD-10-CM | POA: Diagnosis not present

## 2012-02-07 DIAGNOSIS — M6281 Muscle weakness (generalized): Secondary | ICD-10-CM | POA: Diagnosis not present

## 2012-02-23 DIAGNOSIS — Z6826 Body mass index (BMI) 26.0-26.9, adult: Secondary | ICD-10-CM | POA: Diagnosis not present

## 2012-02-23 DIAGNOSIS — Z9181 History of falling: Secondary | ICD-10-CM | POA: Diagnosis not present

## 2012-02-23 DIAGNOSIS — E042 Nontoxic multinodular goiter: Secondary | ICD-10-CM | POA: Diagnosis not present

## 2012-02-23 DIAGNOSIS — E785 Hyperlipidemia, unspecified: Secondary | ICD-10-CM | POA: Diagnosis not present

## 2012-02-23 DIAGNOSIS — Z1331 Encounter for screening for depression: Secondary | ICD-10-CM | POA: Diagnosis not present

## 2012-02-23 DIAGNOSIS — I10 Essential (primary) hypertension: Secondary | ICD-10-CM | POA: Diagnosis not present

## 2012-02-29 DIAGNOSIS — J45909 Unspecified asthma, uncomplicated: Secondary | ICD-10-CM | POA: Diagnosis not present

## 2012-03-06 DIAGNOSIS — D2320 Other benign neoplasm of skin of unspecified ear and external auricular canal: Secondary | ICD-10-CM | POA: Diagnosis not present

## 2012-03-06 DIAGNOSIS — R233 Spontaneous ecchymoses: Secondary | ICD-10-CM | POA: Diagnosis not present

## 2012-03-07 DIAGNOSIS — I519 Heart disease, unspecified: Secondary | ICD-10-CM | POA: Diagnosis not present

## 2012-03-07 DIAGNOSIS — J309 Allergic rhinitis, unspecified: Secondary | ICD-10-CM | POA: Diagnosis not present

## 2012-03-29 DIAGNOSIS — N959 Unspecified menopausal and perimenopausal disorder: Secondary | ICD-10-CM | POA: Diagnosis not present

## 2012-03-29 DIAGNOSIS — L293 Anogenital pruritus, unspecified: Secondary | ICD-10-CM | POA: Diagnosis not present

## 2012-03-29 DIAGNOSIS — Z124 Encounter for screening for malignant neoplasm of cervix: Secondary | ICD-10-CM | POA: Diagnosis not present

## 2012-03-31 DIAGNOSIS — Z1231 Encounter for screening mammogram for malignant neoplasm of breast: Secondary | ICD-10-CM | POA: Diagnosis not present

## 2012-04-06 DIAGNOSIS — Z6825 Body mass index (BMI) 25.0-25.9, adult: Secondary | ICD-10-CM | POA: Diagnosis not present

## 2012-04-06 DIAGNOSIS — J209 Acute bronchitis, unspecified: Secondary | ICD-10-CM | POA: Diagnosis not present

## 2012-04-10 DIAGNOSIS — B029 Zoster without complications: Secondary | ICD-10-CM | POA: Diagnosis not present

## 2012-04-10 DIAGNOSIS — L981 Factitial dermatitis: Secondary | ICD-10-CM | POA: Diagnosis not present

## 2012-04-13 DIAGNOSIS — IMO0002 Reserved for concepts with insufficient information to code with codable children: Secondary | ICD-10-CM | POA: Diagnosis not present

## 2012-04-13 DIAGNOSIS — B029 Zoster without complications: Secondary | ICD-10-CM | POA: Diagnosis not present

## 2012-05-05 DIAGNOSIS — Z6826 Body mass index (BMI) 26.0-26.9, adult: Secondary | ICD-10-CM | POA: Diagnosis not present

## 2012-05-05 DIAGNOSIS — M533 Sacrococcygeal disorders, not elsewhere classified: Secondary | ICD-10-CM | POA: Diagnosis not present

## 2012-05-10 DIAGNOSIS — IMO0002 Reserved for concepts with insufficient information to code with codable children: Secondary | ICD-10-CM | POA: Diagnosis not present

## 2012-05-25 DIAGNOSIS — G603 Idiopathic progressive neuropathy: Secondary | ICD-10-CM | POA: Diagnosis not present

## 2012-05-25 DIAGNOSIS — G250 Essential tremor: Secondary | ICD-10-CM | POA: Diagnosis not present

## 2012-05-29 DIAGNOSIS — I10 Essential (primary) hypertension: Secondary | ICD-10-CM | POA: Diagnosis not present

## 2012-05-29 DIAGNOSIS — J309 Allergic rhinitis, unspecified: Secondary | ICD-10-CM | POA: Diagnosis not present

## 2012-05-29 DIAGNOSIS — E785 Hyperlipidemia, unspecified: Secondary | ICD-10-CM | POA: Diagnosis not present

## 2012-05-29 DIAGNOSIS — E042 Nontoxic multinodular goiter: Secondary | ICD-10-CM | POA: Diagnosis not present

## 2012-05-29 DIAGNOSIS — Z6825 Body mass index (BMI) 25.0-25.9, adult: Secondary | ICD-10-CM | POA: Diagnosis not present

## 2012-05-31 DIAGNOSIS — M779 Enthesopathy, unspecified: Secondary | ICD-10-CM | POA: Diagnosis not present

## 2012-05-31 DIAGNOSIS — M7750 Other enthesopathy of unspecified foot: Secondary | ICD-10-CM | POA: Diagnosis not present

## 2012-06-07 DIAGNOSIS — G252 Other specified forms of tremor: Secondary | ICD-10-CM | POA: Diagnosis not present

## 2012-06-07 DIAGNOSIS — G250 Essential tremor: Secondary | ICD-10-CM | POA: Diagnosis not present

## 2012-06-07 DIAGNOSIS — G603 Idiopathic progressive neuropathy: Secondary | ICD-10-CM | POA: Diagnosis not present

## 2012-07-03 DIAGNOSIS — IMO0002 Reserved for concepts with insufficient information to code with codable children: Secondary | ICD-10-CM | POA: Diagnosis not present

## 2012-07-03 DIAGNOSIS — K031 Abrasion of teeth: Secondary | ICD-10-CM | POA: Diagnosis not present

## 2012-07-05 DIAGNOSIS — J45909 Unspecified asthma, uncomplicated: Secondary | ICD-10-CM | POA: Diagnosis not present

## 2012-07-05 DIAGNOSIS — I519 Heart disease, unspecified: Secondary | ICD-10-CM | POA: Diagnosis not present

## 2012-08-09 DIAGNOSIS — G603 Idiopathic progressive neuropathy: Secondary | ICD-10-CM | POA: Diagnosis not present

## 2012-08-09 DIAGNOSIS — G252 Other specified forms of tremor: Secondary | ICD-10-CM | POA: Diagnosis not present

## 2012-08-30 DIAGNOSIS — S9030XA Contusion of unspecified foot, initial encounter: Secondary | ICD-10-CM | POA: Diagnosis not present

## 2012-09-04 DIAGNOSIS — I10 Essential (primary) hypertension: Secondary | ICD-10-CM | POA: Diagnosis not present

## 2012-09-04 DIAGNOSIS — E042 Nontoxic multinodular goiter: Secondary | ICD-10-CM | POA: Diagnosis not present

## 2012-09-04 DIAGNOSIS — E785 Hyperlipidemia, unspecified: Secondary | ICD-10-CM | POA: Diagnosis not present

## 2012-09-04 DIAGNOSIS — Z6828 Body mass index (BMI) 28.0-28.9, adult: Secondary | ICD-10-CM | POA: Diagnosis not present

## 2012-09-25 DIAGNOSIS — L219 Seborrheic dermatitis, unspecified: Secondary | ICD-10-CM | POA: Diagnosis not present

## 2012-09-25 DIAGNOSIS — L981 Factitial dermatitis: Secondary | ICD-10-CM | POA: Diagnosis not present

## 2012-10-04 DIAGNOSIS — M7750 Other enthesopathy of unspecified foot: Secondary | ICD-10-CM | POA: Diagnosis not present

## 2012-10-12 DIAGNOSIS — R3915 Urgency of urination: Secondary | ICD-10-CM | POA: Diagnosis not present

## 2012-10-12 DIAGNOSIS — R339 Retention of urine, unspecified: Secondary | ICD-10-CM | POA: Diagnosis not present

## 2012-10-12 DIAGNOSIS — R350 Frequency of micturition: Secondary | ICD-10-CM | POA: Diagnosis not present

## 2012-10-12 DIAGNOSIS — N3941 Urge incontinence: Secondary | ICD-10-CM | POA: Diagnosis not present

## 2012-10-12 DIAGNOSIS — N8111 Cystocele, midline: Secondary | ICD-10-CM | POA: Diagnosis not present

## 2012-10-12 DIAGNOSIS — N318 Other neuromuscular dysfunction of bladder: Secondary | ICD-10-CM | POA: Diagnosis not present

## 2012-11-10 DIAGNOSIS — R3915 Urgency of urination: Secondary | ICD-10-CM | POA: Diagnosis not present

## 2012-11-10 DIAGNOSIS — R339 Retention of urine, unspecified: Secondary | ICD-10-CM | POA: Diagnosis not present

## 2012-11-10 DIAGNOSIS — N318 Other neuromuscular dysfunction of bladder: Secondary | ICD-10-CM | POA: Diagnosis not present

## 2012-11-10 DIAGNOSIS — N8111 Cystocele, midline: Secondary | ICD-10-CM | POA: Diagnosis not present

## 2012-11-10 DIAGNOSIS — R350 Frequency of micturition: Secondary | ICD-10-CM | POA: Diagnosis not present

## 2012-11-10 DIAGNOSIS — N3941 Urge incontinence: Secondary | ICD-10-CM | POA: Diagnosis not present

## 2012-11-22 DIAGNOSIS — R339 Retention of urine, unspecified: Secondary | ICD-10-CM | POA: Diagnosis not present

## 2012-11-25 DIAGNOSIS — S0993XA Unspecified injury of face, initial encounter: Secondary | ICD-10-CM | POA: Diagnosis not present

## 2012-11-25 DIAGNOSIS — IMO0002 Reserved for concepts with insufficient information to code with codable children: Secondary | ICD-10-CM | POA: Diagnosis not present

## 2012-11-25 DIAGNOSIS — S0003XA Contusion of scalp, initial encounter: Secondary | ICD-10-CM | POA: Diagnosis not present

## 2012-11-26 DIAGNOSIS — IMO0002 Reserved for concepts with insufficient information to code with codable children: Secondary | ICD-10-CM | POA: Diagnosis not present

## 2012-11-26 DIAGNOSIS — T148XXA Other injury of unspecified body region, initial encounter: Secondary | ICD-10-CM | POA: Diagnosis not present

## 2012-11-26 DIAGNOSIS — Z23 Encounter for immunization: Secondary | ICD-10-CM | POA: Diagnosis not present

## 2012-11-26 DIAGNOSIS — M79609 Pain in unspecified limb: Secondary | ICD-10-CM | POA: Diagnosis not present

## 2012-11-26 DIAGNOSIS — S81009A Unspecified open wound, unspecified knee, initial encounter: Secondary | ICD-10-CM | POA: Diagnosis not present

## 2012-11-28 DIAGNOSIS — S81009A Unspecified open wound, unspecified knee, initial encounter: Secondary | ICD-10-CM | POA: Diagnosis not present

## 2012-11-28 DIAGNOSIS — Z23 Encounter for immunization: Secondary | ICD-10-CM | POA: Diagnosis not present

## 2012-11-28 DIAGNOSIS — Z6827 Body mass index (BMI) 27.0-27.9, adult: Secondary | ICD-10-CM | POA: Diagnosis not present

## 2012-11-29 DIAGNOSIS — L97209 Non-pressure chronic ulcer of unspecified calf with unspecified severity: Secondary | ICD-10-CM | POA: Diagnosis not present

## 2012-11-29 DIAGNOSIS — I890 Lymphedema, not elsewhere classified: Secondary | ICD-10-CM | POA: Diagnosis not present

## 2012-11-29 DIAGNOSIS — I872 Venous insufficiency (chronic) (peripheral): Secondary | ICD-10-CM | POA: Diagnosis not present

## 2012-11-29 DIAGNOSIS — T148XXA Other injury of unspecified body region, initial encounter: Secondary | ICD-10-CM | POA: Diagnosis not present

## 2012-12-01 DIAGNOSIS — N8111 Cystocele, midline: Secondary | ICD-10-CM | POA: Diagnosis not present

## 2012-12-01 DIAGNOSIS — N3941 Urge incontinence: Secondary | ICD-10-CM | POA: Diagnosis not present

## 2012-12-01 DIAGNOSIS — N318 Other neuromuscular dysfunction of bladder: Secondary | ICD-10-CM | POA: Diagnosis not present

## 2012-12-01 DIAGNOSIS — R339 Retention of urine, unspecified: Secondary | ICD-10-CM | POA: Diagnosis not present

## 2012-12-06 DIAGNOSIS — I872 Venous insufficiency (chronic) (peripheral): Secondary | ICD-10-CM | POA: Diagnosis not present

## 2012-12-06 DIAGNOSIS — T07XXXA Unspecified multiple injuries, initial encounter: Secondary | ICD-10-CM | POA: Diagnosis not present

## 2012-12-06 DIAGNOSIS — I890 Lymphedema, not elsewhere classified: Secondary | ICD-10-CM | POA: Diagnosis not present

## 2012-12-06 DIAGNOSIS — L97209 Non-pressure chronic ulcer of unspecified calf with unspecified severity: Secondary | ICD-10-CM | POA: Diagnosis not present

## 2012-12-11 DIAGNOSIS — E785 Hyperlipidemia, unspecified: Secondary | ICD-10-CM | POA: Diagnosis not present

## 2012-12-11 DIAGNOSIS — R609 Edema, unspecified: Secondary | ICD-10-CM | POA: Diagnosis not present

## 2012-12-11 DIAGNOSIS — E042 Nontoxic multinodular goiter: Secondary | ICD-10-CM | POA: Diagnosis not present

## 2012-12-11 DIAGNOSIS — J45909 Unspecified asthma, uncomplicated: Secondary | ICD-10-CM | POA: Diagnosis not present

## 2012-12-11 DIAGNOSIS — I10 Essential (primary) hypertension: Secondary | ICD-10-CM | POA: Diagnosis not present

## 2012-12-13 DIAGNOSIS — L97209 Non-pressure chronic ulcer of unspecified calf with unspecified severity: Secondary | ICD-10-CM | POA: Diagnosis not present

## 2012-12-13 DIAGNOSIS — I872 Venous insufficiency (chronic) (peripheral): Secondary | ICD-10-CM | POA: Diagnosis not present

## 2012-12-13 DIAGNOSIS — T07XXXA Unspecified multiple injuries, initial encounter: Secondary | ICD-10-CM | POA: Diagnosis not present

## 2012-12-19 DIAGNOSIS — L97209 Non-pressure chronic ulcer of unspecified calf with unspecified severity: Secondary | ICD-10-CM | POA: Diagnosis not present

## 2012-12-19 DIAGNOSIS — I890 Lymphedema, not elsewhere classified: Secondary | ICD-10-CM | POA: Diagnosis not present

## 2012-12-19 DIAGNOSIS — G250 Essential tremor: Secondary | ICD-10-CM | POA: Insufficient documentation

## 2012-12-19 DIAGNOSIS — G603 Idiopathic progressive neuropathy: Secondary | ICD-10-CM | POA: Diagnosis not present

## 2012-12-19 DIAGNOSIS — T07XXXA Unspecified multiple injuries, initial encounter: Secondary | ICD-10-CM | POA: Diagnosis not present

## 2012-12-20 DIAGNOSIS — Z961 Presence of intraocular lens: Secondary | ICD-10-CM | POA: Diagnosis not present

## 2012-12-20 DIAGNOSIS — H26499 Other secondary cataract, unspecified eye: Secondary | ICD-10-CM | POA: Diagnosis not present

## 2012-12-20 DIAGNOSIS — H04129 Dry eye syndrome of unspecified lacrimal gland: Secondary | ICD-10-CM | POA: Diagnosis not present

## 2012-12-26 DIAGNOSIS — I872 Venous insufficiency (chronic) (peripheral): Secondary | ICD-10-CM | POA: Diagnosis not present

## 2012-12-26 DIAGNOSIS — I890 Lymphedema, not elsewhere classified: Secondary | ICD-10-CM | POA: Diagnosis not present

## 2012-12-26 DIAGNOSIS — T07XXXA Unspecified multiple injuries, initial encounter: Secondary | ICD-10-CM | POA: Diagnosis not present

## 2012-12-26 DIAGNOSIS — L97209 Non-pressure chronic ulcer of unspecified calf with unspecified severity: Secondary | ICD-10-CM | POA: Diagnosis not present

## 2012-12-29 DIAGNOSIS — T07XXXA Unspecified multiple injuries, initial encounter: Secondary | ICD-10-CM | POA: Diagnosis not present

## 2012-12-29 DIAGNOSIS — R0902 Hypoxemia: Secondary | ICD-10-CM | POA: Diagnosis not present

## 2012-12-29 DIAGNOSIS — I519 Heart disease, unspecified: Secondary | ICD-10-CM | POA: Diagnosis not present

## 2012-12-29 DIAGNOSIS — J45909 Unspecified asthma, uncomplicated: Secondary | ICD-10-CM | POA: Diagnosis not present

## 2012-12-29 DIAGNOSIS — I890 Lymphedema, not elsewhere classified: Secondary | ICD-10-CM | POA: Diagnosis not present

## 2012-12-29 DIAGNOSIS — I872 Venous insufficiency (chronic) (peripheral): Secondary | ICD-10-CM | POA: Diagnosis not present

## 2013-01-02 DIAGNOSIS — I872 Venous insufficiency (chronic) (peripheral): Secondary | ICD-10-CM | POA: Diagnosis not present

## 2013-01-02 DIAGNOSIS — T07XXXA Unspecified multiple injuries, initial encounter: Secondary | ICD-10-CM | POA: Diagnosis not present

## 2013-01-02 DIAGNOSIS — L97209 Non-pressure chronic ulcer of unspecified calf with unspecified severity: Secondary | ICD-10-CM | POA: Diagnosis not present

## 2013-01-02 DIAGNOSIS — I890 Lymphedema, not elsewhere classified: Secondary | ICD-10-CM | POA: Diagnosis not present

## 2013-01-09 DIAGNOSIS — T07XXXA Unspecified multiple injuries, initial encounter: Secondary | ICD-10-CM | POA: Diagnosis not present

## 2013-01-09 DIAGNOSIS — I872 Venous insufficiency (chronic) (peripheral): Secondary | ICD-10-CM | POA: Diagnosis not present

## 2013-01-09 DIAGNOSIS — I890 Lymphedema, not elsewhere classified: Secondary | ICD-10-CM | POA: Diagnosis not present

## 2013-01-09 DIAGNOSIS — L97209 Non-pressure chronic ulcer of unspecified calf with unspecified severity: Secondary | ICD-10-CM | POA: Diagnosis not present

## 2013-01-10 DIAGNOSIS — R0902 Hypoxemia: Secondary | ICD-10-CM | POA: Diagnosis not present

## 2013-01-17 DIAGNOSIS — T07XXXA Unspecified multiple injuries, initial encounter: Secondary | ICD-10-CM | POA: Diagnosis not present

## 2013-01-17 DIAGNOSIS — I890 Lymphedema, not elsewhere classified: Secondary | ICD-10-CM | POA: Diagnosis not present

## 2013-01-17 DIAGNOSIS — I872 Venous insufficiency (chronic) (peripheral): Secondary | ICD-10-CM | POA: Diagnosis not present

## 2013-01-18 DIAGNOSIS — N3941 Urge incontinence: Secondary | ICD-10-CM | POA: Diagnosis not present

## 2013-01-24 DIAGNOSIS — I872 Venous insufficiency (chronic) (peripheral): Secondary | ICD-10-CM | POA: Diagnosis not present

## 2013-01-24 DIAGNOSIS — L97209 Non-pressure chronic ulcer of unspecified calf with unspecified severity: Secondary | ICD-10-CM | POA: Diagnosis not present

## 2013-01-24 DIAGNOSIS — I890 Lymphedema, not elsewhere classified: Secondary | ICD-10-CM | POA: Diagnosis not present

## 2013-01-24 DIAGNOSIS — T07XXXA Unspecified multiple injuries, initial encounter: Secondary | ICD-10-CM | POA: Diagnosis not present

## 2013-02-01 DIAGNOSIS — N3941 Urge incontinence: Secondary | ICD-10-CM | POA: Diagnosis not present

## 2013-02-15 DIAGNOSIS — R3915 Urgency of urination: Secondary | ICD-10-CM | POA: Diagnosis not present

## 2013-02-15 DIAGNOSIS — N3941 Urge incontinence: Secondary | ICD-10-CM | POA: Diagnosis not present

## 2013-02-15 DIAGNOSIS — N318 Other neuromuscular dysfunction of bladder: Secondary | ICD-10-CM | POA: Diagnosis not present

## 2013-02-15 DIAGNOSIS — R339 Retention of urine, unspecified: Secondary | ICD-10-CM | POA: Diagnosis not present

## 2013-02-15 DIAGNOSIS — R350 Frequency of micturition: Secondary | ICD-10-CM | POA: Diagnosis not present

## 2013-02-15 DIAGNOSIS — N8111 Cystocele, midline: Secondary | ICD-10-CM | POA: Diagnosis not present

## 2013-02-20 DIAGNOSIS — J0100 Acute maxillary sinusitis, unspecified: Secondary | ICD-10-CM | POA: Diagnosis not present

## 2013-02-20 DIAGNOSIS — J209 Acute bronchitis, unspecified: Secondary | ICD-10-CM | POA: Diagnosis not present

## 2013-03-12 DIAGNOSIS — E785 Hyperlipidemia, unspecified: Secondary | ICD-10-CM | POA: Diagnosis not present

## 2013-03-12 DIAGNOSIS — E042 Nontoxic multinodular goiter: Secondary | ICD-10-CM | POA: Diagnosis not present

## 2013-03-12 DIAGNOSIS — I10 Essential (primary) hypertension: Secondary | ICD-10-CM | POA: Diagnosis not present

## 2013-03-12 DIAGNOSIS — Z6826 Body mass index (BMI) 26.0-26.9, adult: Secondary | ICD-10-CM | POA: Diagnosis not present

## 2013-03-26 DIAGNOSIS — Z6826 Body mass index (BMI) 26.0-26.9, adult: Secondary | ICD-10-CM | POA: Diagnosis not present

## 2013-03-26 DIAGNOSIS — R002 Palpitations: Secondary | ICD-10-CM | POA: Diagnosis not present

## 2013-03-28 DIAGNOSIS — M7750 Other enthesopathy of unspecified foot: Secondary | ICD-10-CM | POA: Diagnosis not present

## 2013-03-28 DIAGNOSIS — M722 Plantar fascial fibromatosis: Secondary | ICD-10-CM | POA: Diagnosis not present

## 2013-03-28 DIAGNOSIS — IMO0002 Reserved for concepts with insufficient information to code with codable children: Secondary | ICD-10-CM | POA: Diagnosis not present

## 2013-04-02 DIAGNOSIS — Z1231 Encounter for screening mammogram for malignant neoplasm of breast: Secondary | ICD-10-CM | POA: Diagnosis not present

## 2013-04-03 DIAGNOSIS — Z01419 Encounter for gynecological examination (general) (routine) without abnormal findings: Secondary | ICD-10-CM | POA: Diagnosis not present

## 2013-04-03 DIAGNOSIS — N958 Other specified menopausal and perimenopausal disorders: Secondary | ICD-10-CM | POA: Diagnosis not present

## 2013-04-03 DIAGNOSIS — R350 Frequency of micturition: Secondary | ICD-10-CM | POA: Diagnosis not present

## 2013-04-03 DIAGNOSIS — N760 Acute vaginitis: Secondary | ICD-10-CM | POA: Diagnosis not present

## 2013-04-03 DIAGNOSIS — N898 Other specified noninflammatory disorders of vagina: Secondary | ICD-10-CM | POA: Diagnosis not present

## 2013-04-04 DIAGNOSIS — Z1211 Encounter for screening for malignant neoplasm of colon: Secondary | ICD-10-CM | POA: Diagnosis not present

## 2013-04-04 DIAGNOSIS — K219 Gastro-esophageal reflux disease without esophagitis: Secondary | ICD-10-CM | POA: Diagnosis not present

## 2013-04-04 DIAGNOSIS — K222 Esophageal obstruction: Secondary | ICD-10-CM | POA: Diagnosis not present

## 2013-04-04 DIAGNOSIS — K648 Other hemorrhoids: Secondary | ICD-10-CM | POA: Diagnosis not present

## 2013-04-05 DIAGNOSIS — E785 Hyperlipidemia, unspecified: Secondary | ICD-10-CM | POA: Diagnosis not present

## 2013-04-05 DIAGNOSIS — R002 Palpitations: Secondary | ICD-10-CM | POA: Diagnosis not present

## 2013-04-17 DIAGNOSIS — I10 Essential (primary) hypertension: Secondary | ICD-10-CM | POA: Diagnosis not present

## 2013-04-17 DIAGNOSIS — R0789 Other chest pain: Secondary | ICD-10-CM | POA: Diagnosis not present

## 2013-04-17 DIAGNOSIS — R002 Palpitations: Secondary | ICD-10-CM | POA: Diagnosis not present

## 2013-04-17 DIAGNOSIS — R011 Cardiac murmur, unspecified: Secondary | ICD-10-CM | POA: Diagnosis not present

## 2013-04-17 DIAGNOSIS — R0609 Other forms of dyspnea: Secondary | ICD-10-CM | POA: Diagnosis not present

## 2013-04-24 DIAGNOSIS — K644 Residual hemorrhoidal skin tags: Secondary | ICD-10-CM | POA: Diagnosis not present

## 2013-04-24 DIAGNOSIS — Z79899 Other long term (current) drug therapy: Secondary | ICD-10-CM | POA: Diagnosis not present

## 2013-04-24 DIAGNOSIS — Z7982 Long term (current) use of aspirin: Secondary | ICD-10-CM | POA: Diagnosis not present

## 2013-04-24 DIAGNOSIS — J45909 Unspecified asthma, uncomplicated: Secondary | ICD-10-CM | POA: Diagnosis not present

## 2013-04-24 DIAGNOSIS — R002 Palpitations: Secondary | ICD-10-CM | POA: Diagnosis not present

## 2013-04-24 DIAGNOSIS — K2970 Gastritis, unspecified, without bleeding: Secondary | ICD-10-CM | POA: Diagnosis not present

## 2013-04-24 DIAGNOSIS — R490 Dysphonia: Secondary | ICD-10-CM | POA: Diagnosis not present

## 2013-04-24 DIAGNOSIS — M129 Arthropathy, unspecified: Secondary | ICD-10-CM | POA: Diagnosis not present

## 2013-04-24 DIAGNOSIS — E7800 Pure hypercholesterolemia, unspecified: Secondary | ICD-10-CM | POA: Diagnosis not present

## 2013-04-24 DIAGNOSIS — R0609 Other forms of dyspnea: Secondary | ICD-10-CM | POA: Diagnosis not present

## 2013-04-24 DIAGNOSIS — K209 Esophagitis, unspecified without bleeding: Secondary | ICD-10-CM | POA: Diagnosis not present

## 2013-04-24 DIAGNOSIS — R1310 Dysphagia, unspecified: Secondary | ICD-10-CM | POA: Diagnosis not present

## 2013-04-24 DIAGNOSIS — K2990 Gastroduodenitis, unspecified, without bleeding: Secondary | ICD-10-CM | POA: Diagnosis not present

## 2013-04-24 DIAGNOSIS — S51809A Unspecified open wound of unspecified forearm, initial encounter: Secondary | ICD-10-CM | POA: Diagnosis not present

## 2013-04-24 DIAGNOSIS — K449 Diaphragmatic hernia without obstruction or gangrene: Secondary | ICD-10-CM | POA: Diagnosis not present

## 2013-04-24 DIAGNOSIS — Z1211 Encounter for screening for malignant neoplasm of colon: Secondary | ICD-10-CM | POA: Diagnosis not present

## 2013-04-24 DIAGNOSIS — D126 Benign neoplasm of colon, unspecified: Secondary | ICD-10-CM | POA: Diagnosis not present

## 2013-04-24 DIAGNOSIS — K222 Esophageal obstruction: Secondary | ICD-10-CM | POA: Diagnosis not present

## 2013-04-24 DIAGNOSIS — E079 Disorder of thyroid, unspecified: Secondary | ICD-10-CM | POA: Diagnosis not present

## 2013-04-25 DIAGNOSIS — Z6826 Body mass index (BMI) 26.0-26.9, adult: Secondary | ICD-10-CM | POA: Diagnosis not present

## 2013-04-25 DIAGNOSIS — K219 Gastro-esophageal reflux disease without esophagitis: Secondary | ICD-10-CM | POA: Diagnosis not present

## 2013-04-25 DIAGNOSIS — K222 Esophageal obstruction: Secondary | ICD-10-CM | POA: Diagnosis not present

## 2013-04-25 DIAGNOSIS — K449 Diaphragmatic hernia without obstruction or gangrene: Secondary | ICD-10-CM | POA: Diagnosis not present

## 2013-04-27 DIAGNOSIS — S51809A Unspecified open wound of unspecified forearm, initial encounter: Secondary | ICD-10-CM | POA: Diagnosis not present

## 2013-04-30 DIAGNOSIS — R002 Palpitations: Secondary | ICD-10-CM | POA: Diagnosis not present

## 2013-05-03 DIAGNOSIS — I10 Essential (primary) hypertension: Secondary | ICD-10-CM | POA: Diagnosis not present

## 2013-05-03 DIAGNOSIS — E785 Hyperlipidemia, unspecified: Secondary | ICD-10-CM | POA: Diagnosis not present

## 2013-05-03 DIAGNOSIS — R002 Palpitations: Secondary | ICD-10-CM | POA: Diagnosis not present

## 2013-06-07 DIAGNOSIS — N959 Unspecified menopausal and perimenopausal disorder: Secondary | ICD-10-CM | POA: Diagnosis not present

## 2013-06-14 DIAGNOSIS — Z6827 Body mass index (BMI) 27.0-27.9, adult: Secondary | ICD-10-CM | POA: Diagnosis not present

## 2013-06-14 DIAGNOSIS — E042 Nontoxic multinodular goiter: Secondary | ICD-10-CM | POA: Diagnosis not present

## 2013-06-14 DIAGNOSIS — E785 Hyperlipidemia, unspecified: Secondary | ICD-10-CM | POA: Diagnosis not present

## 2013-06-14 DIAGNOSIS — I10 Essential (primary) hypertension: Secondary | ICD-10-CM | POA: Diagnosis not present

## 2013-06-19 DIAGNOSIS — G250 Essential tremor: Secondary | ICD-10-CM | POA: Diagnosis not present

## 2013-06-19 DIAGNOSIS — G603 Idiopathic progressive neuropathy: Secondary | ICD-10-CM | POA: Diagnosis not present

## 2013-06-29 DIAGNOSIS — R490 Dysphonia: Secondary | ICD-10-CM | POA: Diagnosis not present

## 2013-06-29 DIAGNOSIS — J45909 Unspecified asthma, uncomplicated: Secondary | ICD-10-CM | POA: Diagnosis not present

## 2013-06-29 DIAGNOSIS — R05 Cough: Secondary | ICD-10-CM | POA: Diagnosis not present

## 2013-06-29 DIAGNOSIS — R059 Cough, unspecified: Secondary | ICD-10-CM | POA: Diagnosis not present

## 2013-06-29 DIAGNOSIS — J309 Allergic rhinitis, unspecified: Secondary | ICD-10-CM | POA: Diagnosis not present

## 2013-07-04 DIAGNOSIS — Z780 Asymptomatic menopausal state: Secondary | ICD-10-CM | POA: Diagnosis not present

## 2013-07-04 DIAGNOSIS — N951 Menopausal and female climacteric states: Secondary | ICD-10-CM | POA: Diagnosis not present

## 2013-07-11 DIAGNOSIS — R0982 Postnasal drip: Secondary | ICD-10-CM | POA: Diagnosis not present

## 2013-07-11 DIAGNOSIS — R490 Dysphonia: Secondary | ICD-10-CM | POA: Diagnosis not present

## 2013-07-11 DIAGNOSIS — R040 Epistaxis: Secondary | ICD-10-CM | POA: Diagnosis not present

## 2013-07-11 DIAGNOSIS — R51 Headache: Secondary | ICD-10-CM | POA: Diagnosis not present

## 2013-07-28 DIAGNOSIS — L570 Actinic keratosis: Secondary | ICD-10-CM | POA: Diagnosis not present

## 2013-07-28 DIAGNOSIS — L821 Other seborrheic keratosis: Secondary | ICD-10-CM | POA: Diagnosis not present

## 2013-08-26 DIAGNOSIS — L03119 Cellulitis of unspecified part of limb: Secondary | ICD-10-CM | POA: Diagnosis not present

## 2013-08-26 DIAGNOSIS — L02419 Cutaneous abscess of limb, unspecified: Secondary | ICD-10-CM | POA: Diagnosis not present

## 2013-09-07 DIAGNOSIS — M778 Other enthesopathies, not elsewhere classified: Secondary | ICD-10-CM | POA: Insufficient documentation

## 2013-09-07 DIAGNOSIS — R490 Dysphonia: Secondary | ICD-10-CM | POA: Insufficient documentation

## 2013-09-07 DIAGNOSIS — N760 Acute vaginitis: Secondary | ICD-10-CM | POA: Insufficient documentation

## 2013-09-07 DIAGNOSIS — M779 Enthesopathy, unspecified: Secondary | ICD-10-CM

## 2013-09-07 DIAGNOSIS — E040 Nontoxic diffuse goiter: Secondary | ICD-10-CM | POA: Insufficient documentation

## 2013-09-07 DIAGNOSIS — B370 Candidal stomatitis: Secondary | ICD-10-CM | POA: Insufficient documentation

## 2013-09-07 DIAGNOSIS — M25476 Effusion, unspecified foot: Secondary | ICD-10-CM | POA: Insufficient documentation

## 2013-09-07 DIAGNOSIS — H903 Sensorineural hearing loss, bilateral: Secondary | ICD-10-CM | POA: Insufficient documentation

## 2013-09-07 DIAGNOSIS — J45909 Unspecified asthma, uncomplicated: Secondary | ICD-10-CM | POA: Insufficient documentation

## 2013-09-07 DIAGNOSIS — N3941 Urge incontinence: Secondary | ICD-10-CM | POA: Insufficient documentation

## 2013-09-07 DIAGNOSIS — R05 Cough: Secondary | ICD-10-CM | POA: Insufficient documentation

## 2013-09-07 DIAGNOSIS — J301 Allergic rhinitis due to pollen: Secondary | ICD-10-CM | POA: Insufficient documentation

## 2013-09-07 DIAGNOSIS — L292 Pruritus vulvae: Secondary | ICD-10-CM | POA: Insufficient documentation

## 2013-09-07 DIAGNOSIS — I10 Essential (primary) hypertension: Secondary | ICD-10-CM | POA: Insufficient documentation

## 2013-09-07 DIAGNOSIS — N951 Menopausal and female climacteric states: Secondary | ICD-10-CM | POA: Insufficient documentation

## 2013-09-07 DIAGNOSIS — M7742 Metatarsalgia, left foot: Secondary | ICD-10-CM | POA: Insufficient documentation

## 2013-09-07 DIAGNOSIS — G629 Polyneuropathy, unspecified: Secondary | ICD-10-CM | POA: Insufficient documentation

## 2013-09-07 DIAGNOSIS — R0902 Hypoxemia: Secondary | ICD-10-CM | POA: Insufficient documentation

## 2013-09-07 DIAGNOSIS — R339 Retention of urine, unspecified: Secondary | ICD-10-CM | POA: Insufficient documentation

## 2013-09-07 DIAGNOSIS — N3281 Overactive bladder: Secondary | ICD-10-CM | POA: Insufficient documentation

## 2013-09-07 DIAGNOSIS — E0590 Thyrotoxicosis, unspecified without thyrotoxic crisis or storm: Secondary | ICD-10-CM | POA: Insufficient documentation

## 2013-09-07 DIAGNOSIS — H9313 Tinnitus, bilateral: Secondary | ICD-10-CM | POA: Insufficient documentation

## 2013-09-07 DIAGNOSIS — M792 Neuralgia and neuritis, unspecified: Secondary | ICD-10-CM | POA: Insufficient documentation

## 2013-09-07 DIAGNOSIS — R350 Frequency of micturition: Secondary | ICD-10-CM | POA: Insufficient documentation

## 2013-09-07 DIAGNOSIS — M7750 Other enthesopathy of unspecified foot: Secondary | ICD-10-CM | POA: Insufficient documentation

## 2013-09-07 DIAGNOSIS — I5189 Other ill-defined heart diseases: Secondary | ICD-10-CM | POA: Insufficient documentation

## 2013-09-07 DIAGNOSIS — IMO0002 Reserved for concepts with insufficient information to code with codable children: Secondary | ICD-10-CM | POA: Insufficient documentation

## 2013-09-07 DIAGNOSIS — K219 Gastro-esophageal reflux disease without esophagitis: Secondary | ICD-10-CM | POA: Insufficient documentation

## 2013-09-07 DIAGNOSIS — J309 Allergic rhinitis, unspecified: Secondary | ICD-10-CM | POA: Insufficient documentation

## 2013-09-07 DIAGNOSIS — R51 Headache: Secondary | ICD-10-CM

## 2013-09-07 DIAGNOSIS — R059 Cough, unspecified: Secondary | ICD-10-CM | POA: Insufficient documentation

## 2013-09-07 DIAGNOSIS — M722 Plantar fascial fibromatosis: Secondary | ICD-10-CM | POA: Insufficient documentation

## 2013-09-07 DIAGNOSIS — R0982 Postnasal drip: Secondary | ICD-10-CM | POA: Insufficient documentation

## 2013-09-07 DIAGNOSIS — R3989 Other symptoms and signs involving the genitourinary system: Secondary | ICD-10-CM | POA: Insufficient documentation

## 2013-09-07 DIAGNOSIS — M7741 Metatarsalgia, right foot: Secondary | ICD-10-CM | POA: Insufficient documentation

## 2013-09-07 DIAGNOSIS — R3915 Urgency of urination: Secondary | ICD-10-CM | POA: Insufficient documentation

## 2013-09-07 DIAGNOSIS — R609 Edema, unspecified: Secondary | ICD-10-CM | POA: Insufficient documentation

## 2013-09-07 DIAGNOSIS — R519 Headache, unspecified: Secondary | ICD-10-CM | POA: Insufficient documentation

## 2013-09-20 DIAGNOSIS — I10 Essential (primary) hypertension: Secondary | ICD-10-CM | POA: Diagnosis not present

## 2013-09-20 DIAGNOSIS — E042 Nontoxic multinodular goiter: Secondary | ICD-10-CM | POA: Diagnosis not present

## 2013-09-20 DIAGNOSIS — Z6828 Body mass index (BMI) 28.0-28.9, adult: Secondary | ICD-10-CM | POA: Diagnosis not present

## 2013-09-20 DIAGNOSIS — E785 Hyperlipidemia, unspecified: Secondary | ICD-10-CM | POA: Diagnosis not present

## 2013-11-06 DIAGNOSIS — R002 Palpitations: Secondary | ICD-10-CM | POA: Diagnosis not present

## 2013-11-06 DIAGNOSIS — E785 Hyperlipidemia, unspecified: Secondary | ICD-10-CM | POA: Diagnosis not present

## 2013-11-06 DIAGNOSIS — I10 Essential (primary) hypertension: Secondary | ICD-10-CM | POA: Diagnosis not present

## 2013-11-29 DIAGNOSIS — J301 Allergic rhinitis due to pollen: Secondary | ICD-10-CM | POA: Diagnosis not present

## 2013-11-29 DIAGNOSIS — J45909 Unspecified asthma, uncomplicated: Secondary | ICD-10-CM | POA: Diagnosis not present

## 2013-12-04 DIAGNOSIS — R0982 Postnasal drip: Secondary | ICD-10-CM | POA: Diagnosis not present

## 2013-12-04 DIAGNOSIS — R490 Dysphonia: Secondary | ICD-10-CM | POA: Diagnosis not present

## 2013-12-04 DIAGNOSIS — K219 Gastro-esophageal reflux disease without esophagitis: Secondary | ICD-10-CM | POA: Diagnosis not present

## 2013-12-25 DIAGNOSIS — Z23 Encounter for immunization: Secondary | ICD-10-CM | POA: Diagnosis not present

## 2013-12-25 DIAGNOSIS — R600 Localized edema: Secondary | ICD-10-CM | POA: Diagnosis not present

## 2013-12-25 DIAGNOSIS — Z6829 Body mass index (BMI) 29.0-29.9, adult: Secondary | ICD-10-CM | POA: Diagnosis not present

## 2013-12-25 DIAGNOSIS — I10 Essential (primary) hypertension: Secondary | ICD-10-CM | POA: Diagnosis not present

## 2013-12-25 DIAGNOSIS — K219 Gastro-esophageal reflux disease without esophagitis: Secondary | ICD-10-CM | POA: Diagnosis not present

## 2013-12-25 DIAGNOSIS — E042 Nontoxic multinodular goiter: Secondary | ICD-10-CM | POA: Diagnosis not present

## 2013-12-25 DIAGNOSIS — J4530 Mild persistent asthma, uncomplicated: Secondary | ICD-10-CM | POA: Diagnosis not present

## 2013-12-25 DIAGNOSIS — E785 Hyperlipidemia, unspecified: Secondary | ICD-10-CM | POA: Diagnosis not present

## 2013-12-31 DIAGNOSIS — H04123 Dry eye syndrome of bilateral lacrimal glands: Secondary | ICD-10-CM | POA: Diagnosis not present

## 2013-12-31 DIAGNOSIS — H26492 Other secondary cataract, left eye: Secondary | ICD-10-CM | POA: Diagnosis not present

## 2013-12-31 DIAGNOSIS — Z961 Presence of intraocular lens: Secondary | ICD-10-CM | POA: Diagnosis not present

## 2014-01-14 DIAGNOSIS — M7752 Other enthesopathy of left foot: Secondary | ICD-10-CM | POA: Diagnosis not present

## 2014-01-14 DIAGNOSIS — M7741 Metatarsalgia, right foot: Secondary | ICD-10-CM | POA: Diagnosis not present

## 2014-01-14 DIAGNOSIS — M7751 Other enthesopathy of right foot: Secondary | ICD-10-CM | POA: Diagnosis not present

## 2014-01-14 DIAGNOSIS — M722 Plantar fascial fibromatosis: Secondary | ICD-10-CM | POA: Diagnosis not present

## 2014-01-16 DIAGNOSIS — H26492 Other secondary cataract, left eye: Secondary | ICD-10-CM | POA: Diagnosis not present

## 2014-02-04 DIAGNOSIS — K219 Gastro-esophageal reflux disease without esophagitis: Secondary | ICD-10-CM | POA: Diagnosis not present

## 2014-02-04 DIAGNOSIS — H6121 Impacted cerumen, right ear: Secondary | ICD-10-CM | POA: Diagnosis not present

## 2014-02-04 DIAGNOSIS — R490 Dysphonia: Secondary | ICD-10-CM | POA: Diagnosis not present

## 2014-02-24 DIAGNOSIS — J0100 Acute maxillary sinusitis, unspecified: Secondary | ICD-10-CM | POA: Diagnosis not present

## 2014-03-04 DIAGNOSIS — Z6830 Body mass index (BMI) 30.0-30.9, adult: Secondary | ICD-10-CM | POA: Diagnosis not present

## 2014-03-04 DIAGNOSIS — J208 Acute bronchitis due to other specified organisms: Secondary | ICD-10-CM | POA: Diagnosis not present

## 2014-03-21 DIAGNOSIS — B001 Herpesviral vesicular dermatitis: Secondary | ICD-10-CM | POA: Diagnosis not present

## 2014-03-21 DIAGNOSIS — E042 Nontoxic multinodular goiter: Secondary | ICD-10-CM | POA: Diagnosis not present

## 2014-03-21 DIAGNOSIS — Z6829 Body mass index (BMI) 29.0-29.9, adult: Secondary | ICD-10-CM | POA: Diagnosis not present

## 2014-03-21 DIAGNOSIS — K219 Gastro-esophageal reflux disease without esophagitis: Secondary | ICD-10-CM | POA: Diagnosis not present

## 2014-04-03 DIAGNOSIS — E785 Hyperlipidemia, unspecified: Secondary | ICD-10-CM | POA: Diagnosis not present

## 2014-04-03 DIAGNOSIS — R600 Localized edema: Secondary | ICD-10-CM | POA: Diagnosis not present

## 2014-04-03 DIAGNOSIS — J4530 Mild persistent asthma, uncomplicated: Secondary | ICD-10-CM | POA: Diagnosis not present

## 2014-04-03 DIAGNOSIS — I10 Essential (primary) hypertension: Secondary | ICD-10-CM | POA: Diagnosis not present

## 2014-04-03 DIAGNOSIS — K219 Gastro-esophageal reflux disease without esophagitis: Secondary | ICD-10-CM | POA: Diagnosis not present

## 2014-04-03 DIAGNOSIS — E042 Nontoxic multinodular goiter: Secondary | ICD-10-CM | POA: Diagnosis not present

## 2014-04-03 DIAGNOSIS — Z23 Encounter for immunization: Secondary | ICD-10-CM | POA: Diagnosis not present

## 2014-04-03 DIAGNOSIS — Z6829 Body mass index (BMI) 29.0-29.9, adult: Secondary | ICD-10-CM | POA: Diagnosis not present

## 2014-04-08 DIAGNOSIS — N959 Unspecified menopausal and perimenopausal disorder: Secondary | ICD-10-CM | POA: Diagnosis not present

## 2014-04-08 DIAGNOSIS — Z01419 Encounter for gynecological examination (general) (routine) without abnormal findings: Secondary | ICD-10-CM | POA: Diagnosis not present

## 2014-04-23 DIAGNOSIS — N3941 Urge incontinence: Secondary | ICD-10-CM | POA: Diagnosis not present

## 2014-04-23 DIAGNOSIS — R339 Retention of urine, unspecified: Secondary | ICD-10-CM | POA: Diagnosis not present

## 2014-04-23 DIAGNOSIS — N3281 Overactive bladder: Secondary | ICD-10-CM | POA: Diagnosis not present

## 2014-04-23 DIAGNOSIS — N8110 Cystocele, unspecified: Secondary | ICD-10-CM | POA: Diagnosis not present

## 2014-04-25 DIAGNOSIS — Z1231 Encounter for screening mammogram for malignant neoplasm of breast: Secondary | ICD-10-CM | POA: Diagnosis not present

## 2014-05-28 DIAGNOSIS — N3941 Urge incontinence: Secondary | ICD-10-CM | POA: Diagnosis not present

## 2014-05-28 DIAGNOSIS — N3281 Overactive bladder: Secondary | ICD-10-CM | POA: Diagnosis not present

## 2014-05-28 DIAGNOSIS — N8111 Cystocele, midline: Secondary | ICD-10-CM | POA: Diagnosis not present

## 2014-05-28 DIAGNOSIS — N959 Unspecified menopausal and perimenopausal disorder: Secondary | ICD-10-CM | POA: Diagnosis not present

## 2014-05-28 DIAGNOSIS — R350 Frequency of micturition: Secondary | ICD-10-CM | POA: Diagnosis not present

## 2014-06-03 DIAGNOSIS — K219 Gastro-esophageal reflux disease without esophagitis: Secondary | ICD-10-CM | POA: Diagnosis not present

## 2014-06-03 DIAGNOSIS — J45909 Unspecified asthma, uncomplicated: Secondary | ICD-10-CM | POA: Diagnosis not present

## 2014-06-03 DIAGNOSIS — J301 Allergic rhinitis due to pollen: Secondary | ICD-10-CM | POA: Diagnosis not present

## 2014-06-05 DIAGNOSIS — G603 Idiopathic progressive neuropathy: Secondary | ICD-10-CM | POA: Diagnosis not present

## 2014-06-05 DIAGNOSIS — G250 Essential tremor: Secondary | ICD-10-CM | POA: Diagnosis not present

## 2014-06-05 DIAGNOSIS — G5692 Unspecified mononeuropathy of left upper limb: Secondary | ICD-10-CM | POA: Diagnosis not present

## 2014-06-12 DIAGNOSIS — J208 Acute bronchitis due to other specified organisms: Secondary | ICD-10-CM | POA: Diagnosis not present

## 2014-06-12 DIAGNOSIS — Z6830 Body mass index (BMI) 30.0-30.9, adult: Secondary | ICD-10-CM | POA: Diagnosis not present

## 2014-06-20 DIAGNOSIS — N8111 Cystocele, midline: Secondary | ICD-10-CM | POA: Diagnosis not present

## 2014-06-20 DIAGNOSIS — N3281 Overactive bladder: Secondary | ICD-10-CM | POA: Diagnosis not present

## 2014-06-20 DIAGNOSIS — R3915 Urgency of urination: Secondary | ICD-10-CM | POA: Diagnosis not present

## 2014-06-20 DIAGNOSIS — N3941 Urge incontinence: Secondary | ICD-10-CM | POA: Diagnosis not present

## 2014-06-20 DIAGNOSIS — R202 Paresthesia of skin: Secondary | ICD-10-CM | POA: Diagnosis not present

## 2014-06-25 DIAGNOSIS — S338XXA Sprain of other parts of lumbar spine and pelvis, initial encounter: Secondary | ICD-10-CM | POA: Diagnosis not present

## 2014-06-25 DIAGNOSIS — I890 Lymphedema, not elsewhere classified: Secondary | ICD-10-CM | POA: Diagnosis not present

## 2014-06-25 DIAGNOSIS — L97209 Non-pressure chronic ulcer of unspecified calf with unspecified severity: Secondary | ICD-10-CM | POA: Diagnosis not present

## 2014-06-25 DIAGNOSIS — I872 Venous insufficiency (chronic) (peripheral): Secondary | ICD-10-CM | POA: Diagnosis not present

## 2014-06-25 DIAGNOSIS — M9903 Segmental and somatic dysfunction of lumbar region: Secondary | ICD-10-CM | POA: Diagnosis not present

## 2014-06-26 DIAGNOSIS — S338XXA Sprain of other parts of lumbar spine and pelvis, initial encounter: Secondary | ICD-10-CM | POA: Diagnosis not present

## 2014-06-26 DIAGNOSIS — M9903 Segmental and somatic dysfunction of lumbar region: Secondary | ICD-10-CM | POA: Diagnosis not present

## 2014-06-27 DIAGNOSIS — S338XXA Sprain of other parts of lumbar spine and pelvis, initial encounter: Secondary | ICD-10-CM | POA: Diagnosis not present

## 2014-06-27 DIAGNOSIS — M9903 Segmental and somatic dysfunction of lumbar region: Secondary | ICD-10-CM | POA: Diagnosis not present

## 2014-07-01 DIAGNOSIS — S338XXA Sprain of other parts of lumbar spine and pelvis, initial encounter: Secondary | ICD-10-CM | POA: Diagnosis not present

## 2014-07-01 DIAGNOSIS — M9903 Segmental and somatic dysfunction of lumbar region: Secondary | ICD-10-CM | POA: Diagnosis not present

## 2014-07-02 DIAGNOSIS — G250 Essential tremor: Secondary | ICD-10-CM | POA: Diagnosis not present

## 2014-07-02 DIAGNOSIS — G5692 Unspecified mononeuropathy of left upper limb: Secondary | ICD-10-CM | POA: Diagnosis not present

## 2014-07-02 DIAGNOSIS — G35 Multiple sclerosis: Secondary | ICD-10-CM | POA: Diagnosis not present

## 2014-07-02 DIAGNOSIS — G603 Idiopathic progressive neuropathy: Secondary | ICD-10-CM | POA: Diagnosis not present

## 2014-07-03 DIAGNOSIS — I872 Venous insufficiency (chronic) (peripheral): Secondary | ICD-10-CM | POA: Diagnosis not present

## 2014-07-03 DIAGNOSIS — I890 Lymphedema, not elsewhere classified: Secondary | ICD-10-CM | POA: Diagnosis not present

## 2014-07-03 DIAGNOSIS — L97209 Non-pressure chronic ulcer of unspecified calf with unspecified severity: Secondary | ICD-10-CM | POA: Diagnosis not present

## 2014-07-03 DIAGNOSIS — S338XXA Sprain of other parts of lumbar spine and pelvis, initial encounter: Secondary | ICD-10-CM | POA: Diagnosis not present

## 2014-07-03 DIAGNOSIS — M9903 Segmental and somatic dysfunction of lumbar region: Secondary | ICD-10-CM | POA: Diagnosis not present

## 2014-07-04 DIAGNOSIS — S338XXA Sprain of other parts of lumbar spine and pelvis, initial encounter: Secondary | ICD-10-CM | POA: Diagnosis not present

## 2014-07-04 DIAGNOSIS — M9903 Segmental and somatic dysfunction of lumbar region: Secondary | ICD-10-CM | POA: Diagnosis not present

## 2014-07-08 DIAGNOSIS — S338XXA Sprain of other parts of lumbar spine and pelvis, initial encounter: Secondary | ICD-10-CM | POA: Diagnosis not present

## 2014-07-08 DIAGNOSIS — M9903 Segmental and somatic dysfunction of lumbar region: Secondary | ICD-10-CM | POA: Diagnosis not present

## 2014-07-09 DIAGNOSIS — J4530 Mild persistent asthma, uncomplicated: Secondary | ICD-10-CM | POA: Diagnosis not present

## 2014-07-09 DIAGNOSIS — E042 Nontoxic multinodular goiter: Secondary | ICD-10-CM | POA: Diagnosis not present

## 2014-07-09 DIAGNOSIS — R600 Localized edema: Secondary | ICD-10-CM | POA: Diagnosis not present

## 2014-07-09 DIAGNOSIS — Z9181 History of falling: Secondary | ICD-10-CM | POA: Diagnosis not present

## 2014-07-09 DIAGNOSIS — E785 Hyperlipidemia, unspecified: Secondary | ICD-10-CM | POA: Diagnosis not present

## 2014-07-09 DIAGNOSIS — K219 Gastro-esophageal reflux disease without esophagitis: Secondary | ICD-10-CM | POA: Diagnosis not present

## 2014-07-09 DIAGNOSIS — Z1389 Encounter for screening for other disorder: Secondary | ICD-10-CM | POA: Diagnosis not present

## 2014-07-09 DIAGNOSIS — Z6830 Body mass index (BMI) 30.0-30.9, adult: Secondary | ICD-10-CM | POA: Diagnosis not present

## 2014-07-09 DIAGNOSIS — I10 Essential (primary) hypertension: Secondary | ICD-10-CM | POA: Diagnosis not present

## 2014-07-10 DIAGNOSIS — I890 Lymphedema, not elsewhere classified: Secondary | ICD-10-CM | POA: Diagnosis not present

## 2014-07-10 DIAGNOSIS — M9903 Segmental and somatic dysfunction of lumbar region: Secondary | ICD-10-CM | POA: Diagnosis not present

## 2014-07-10 DIAGNOSIS — L728 Other follicular cysts of the skin and subcutaneous tissue: Secondary | ICD-10-CM | POA: Diagnosis not present

## 2014-07-10 DIAGNOSIS — S338XXA Sprain of other parts of lumbar spine and pelvis, initial encounter: Secondary | ICD-10-CM | POA: Diagnosis not present

## 2014-07-10 DIAGNOSIS — I872 Venous insufficiency (chronic) (peripheral): Secondary | ICD-10-CM | POA: Diagnosis not present

## 2014-07-10 DIAGNOSIS — L821 Other seborrheic keratosis: Secondary | ICD-10-CM | POA: Diagnosis not present

## 2014-07-10 DIAGNOSIS — L97209 Non-pressure chronic ulcer of unspecified calf with unspecified severity: Secondary | ICD-10-CM | POA: Diagnosis not present

## 2014-07-10 DIAGNOSIS — D2239 Melanocytic nevi of other parts of face: Secondary | ICD-10-CM | POA: Diagnosis not present

## 2014-07-12 DIAGNOSIS — G5692 Unspecified mononeuropathy of left upper limb: Secondary | ICD-10-CM | POA: Diagnosis not present

## 2014-07-12 DIAGNOSIS — S0990XA Unspecified injury of head, initial encounter: Secondary | ICD-10-CM | POA: Diagnosis not present

## 2014-07-12 DIAGNOSIS — G35 Multiple sclerosis: Secondary | ICD-10-CM | POA: Diagnosis not present

## 2014-07-16 DIAGNOSIS — R490 Dysphonia: Secondary | ICD-10-CM | POA: Diagnosis not present

## 2014-07-16 DIAGNOSIS — K219 Gastro-esophageal reflux disease without esophagitis: Secondary | ICD-10-CM | POA: Diagnosis not present

## 2014-07-16 DIAGNOSIS — S338XXA Sprain of other parts of lumbar spine and pelvis, initial encounter: Secondary | ICD-10-CM | POA: Diagnosis not present

## 2014-07-16 DIAGNOSIS — M9903 Segmental and somatic dysfunction of lumbar region: Secondary | ICD-10-CM | POA: Diagnosis not present

## 2014-07-17 DIAGNOSIS — G35 Multiple sclerosis: Secondary | ICD-10-CM | POA: Diagnosis not present

## 2014-07-17 DIAGNOSIS — G250 Essential tremor: Secondary | ICD-10-CM | POA: Diagnosis not present

## 2014-07-17 DIAGNOSIS — G5692 Unspecified mononeuropathy of left upper limb: Secondary | ICD-10-CM | POA: Diagnosis not present

## 2014-07-18 DIAGNOSIS — S338XXA Sprain of other parts of lumbar spine and pelvis, initial encounter: Secondary | ICD-10-CM | POA: Diagnosis not present

## 2014-07-18 DIAGNOSIS — M9903 Segmental and somatic dysfunction of lumbar region: Secondary | ICD-10-CM | POA: Diagnosis not present

## 2014-07-22 DIAGNOSIS — S338XXA Sprain of other parts of lumbar spine and pelvis, initial encounter: Secondary | ICD-10-CM | POA: Diagnosis not present

## 2014-07-22 DIAGNOSIS — M9903 Segmental and somatic dysfunction of lumbar region: Secondary | ICD-10-CM | POA: Diagnosis not present

## 2014-07-24 DIAGNOSIS — M9903 Segmental and somatic dysfunction of lumbar region: Secondary | ICD-10-CM | POA: Diagnosis not present

## 2014-07-24 DIAGNOSIS — S338XXA Sprain of other parts of lumbar spine and pelvis, initial encounter: Secondary | ICD-10-CM | POA: Diagnosis not present

## 2014-07-25 DIAGNOSIS — G250 Essential tremor: Secondary | ICD-10-CM | POA: Diagnosis not present

## 2014-07-25 DIAGNOSIS — G5692 Unspecified mononeuropathy of left upper limb: Secondary | ICD-10-CM | POA: Diagnosis not present

## 2014-07-25 DIAGNOSIS — G603 Idiopathic progressive neuropathy: Secondary | ICD-10-CM | POA: Diagnosis not present

## 2014-07-31 DIAGNOSIS — M9903 Segmental and somatic dysfunction of lumbar region: Secondary | ICD-10-CM | POA: Diagnosis not present

## 2014-07-31 DIAGNOSIS — S338XXA Sprain of other parts of lumbar spine and pelvis, initial encounter: Secondary | ICD-10-CM | POA: Diagnosis not present

## 2014-08-07 DIAGNOSIS — S338XXA Sprain of other parts of lumbar spine and pelvis, initial encounter: Secondary | ICD-10-CM | POA: Diagnosis not present

## 2014-08-07 DIAGNOSIS — M9903 Segmental and somatic dysfunction of lumbar region: Secondary | ICD-10-CM | POA: Diagnosis not present

## 2014-08-28 DIAGNOSIS — G250 Essential tremor: Secondary | ICD-10-CM | POA: Diagnosis not present

## 2014-08-28 DIAGNOSIS — G5692 Unspecified mononeuropathy of left upper limb: Secondary | ICD-10-CM | POA: Diagnosis not present

## 2014-09-17 DIAGNOSIS — L02612 Cutaneous abscess of left foot: Secondary | ICD-10-CM | POA: Diagnosis not present

## 2014-09-17 DIAGNOSIS — L02611 Cutaneous abscess of right foot: Secondary | ICD-10-CM | POA: Diagnosis not present

## 2014-10-15 DIAGNOSIS — Z9181 History of falling: Secondary | ICD-10-CM | POA: Diagnosis not present

## 2014-10-15 DIAGNOSIS — Z1389 Encounter for screening for other disorder: Secondary | ICD-10-CM | POA: Diagnosis not present

## 2014-10-15 DIAGNOSIS — I10 Essential (primary) hypertension: Secondary | ICD-10-CM | POA: Diagnosis not present

## 2014-10-15 DIAGNOSIS — E785 Hyperlipidemia, unspecified: Secondary | ICD-10-CM | POA: Diagnosis not present

## 2014-10-15 DIAGNOSIS — Z23 Encounter for immunization: Secondary | ICD-10-CM | POA: Diagnosis not present

## 2014-10-15 DIAGNOSIS — J4530 Mild persistent asthma, uncomplicated: Secondary | ICD-10-CM | POA: Diagnosis not present

## 2014-10-15 DIAGNOSIS — R600 Localized edema: Secondary | ICD-10-CM | POA: Diagnosis not present

## 2014-10-15 DIAGNOSIS — E042 Nontoxic multinodular goiter: Secondary | ICD-10-CM | POA: Diagnosis not present

## 2014-10-22 DIAGNOSIS — N959 Unspecified menopausal and perimenopausal disorder: Secondary | ICD-10-CM | POA: Diagnosis not present

## 2014-11-27 DIAGNOSIS — G250 Essential tremor: Secondary | ICD-10-CM | POA: Diagnosis not present

## 2014-11-27 DIAGNOSIS — G5692 Unspecified mononeuropathy of left upper limb: Secondary | ICD-10-CM | POA: Diagnosis not present

## 2014-12-04 DIAGNOSIS — J301 Allergic rhinitis due to pollen: Secondary | ICD-10-CM | POA: Diagnosis not present

## 2014-12-04 DIAGNOSIS — J45909 Unspecified asthma, uncomplicated: Secondary | ICD-10-CM | POA: Diagnosis not present

## 2014-12-16 DIAGNOSIS — J208 Acute bronchitis due to other specified organisms: Secondary | ICD-10-CM | POA: Diagnosis not present

## 2014-12-16 DIAGNOSIS — Z6831 Body mass index (BMI) 31.0-31.9, adult: Secondary | ICD-10-CM | POA: Diagnosis not present

## 2014-12-16 DIAGNOSIS — E669 Obesity, unspecified: Secondary | ICD-10-CM | POA: Diagnosis not present

## 2014-12-16 DIAGNOSIS — J019 Acute sinusitis, unspecified: Secondary | ICD-10-CM | POA: Diagnosis not present

## 2015-01-09 DIAGNOSIS — Z6831 Body mass index (BMI) 31.0-31.9, adult: Secondary | ICD-10-CM | POA: Diagnosis not present

## 2015-01-09 DIAGNOSIS — J208 Acute bronchitis due to other specified organisms: Secondary | ICD-10-CM | POA: Diagnosis not present

## 2015-01-22 DIAGNOSIS — E042 Nontoxic multinodular goiter: Secondary | ICD-10-CM | POA: Diagnosis not present

## 2015-01-22 DIAGNOSIS — R600 Localized edema: Secondary | ICD-10-CM | POA: Diagnosis not present

## 2015-01-22 DIAGNOSIS — E785 Hyperlipidemia, unspecified: Secondary | ICD-10-CM | POA: Diagnosis not present

## 2015-01-22 DIAGNOSIS — G5692 Unspecified mononeuropathy of left upper limb: Secondary | ICD-10-CM | POA: Diagnosis not present

## 2015-01-22 DIAGNOSIS — J4530 Mild persistent asthma, uncomplicated: Secondary | ICD-10-CM | POA: Diagnosis not present

## 2015-01-22 DIAGNOSIS — I10 Essential (primary) hypertension: Secondary | ICD-10-CM | POA: Diagnosis not present

## 2015-01-22 DIAGNOSIS — G250 Essential tremor: Secondary | ICD-10-CM | POA: Diagnosis not present

## 2015-01-22 DIAGNOSIS — G919 Hydrocephalus, unspecified: Secondary | ICD-10-CM | POA: Diagnosis not present

## 2015-01-22 DIAGNOSIS — Z6830 Body mass index (BMI) 30.0-30.9, adult: Secondary | ICD-10-CM | POA: Diagnosis not present

## 2015-01-25 DIAGNOSIS — S0990XA Unspecified injury of head, initial encounter: Secondary | ICD-10-CM | POA: Diagnosis not present

## 2015-01-25 DIAGNOSIS — R0602 Shortness of breath: Secondary | ICD-10-CM | POA: Diagnosis not present

## 2015-01-25 DIAGNOSIS — R55 Syncope and collapse: Secondary | ICD-10-CM | POA: Diagnosis not present

## 2015-01-25 DIAGNOSIS — I6200 Nontraumatic subdural hemorrhage, unspecified: Secondary | ICD-10-CM | POA: Diagnosis not present

## 2015-01-25 DIAGNOSIS — R51 Headache: Secondary | ICD-10-CM | POA: Diagnosis not present

## 2015-01-25 DIAGNOSIS — S065X1A Traumatic subdural hemorrhage with loss of consciousness of 30 minutes or less, initial encounter: Secondary | ICD-10-CM | POA: Diagnosis not present

## 2015-01-25 DIAGNOSIS — S199XXA Unspecified injury of neck, initial encounter: Secondary | ICD-10-CM | POA: Diagnosis not present

## 2015-01-25 DIAGNOSIS — W19XXXA Unspecified fall, initial encounter: Secondary | ICD-10-CM | POA: Diagnosis not present

## 2015-01-26 DIAGNOSIS — Z79899 Other long term (current) drug therapy: Secondary | ICD-10-CM | POA: Diagnosis not present

## 2015-01-26 DIAGNOSIS — S0990XA Unspecified injury of head, initial encounter: Secondary | ICD-10-CM | POA: Diagnosis not present

## 2015-01-26 DIAGNOSIS — Z883 Allergy status to other anti-infective agents status: Secondary | ICD-10-CM | POA: Diagnosis not present

## 2015-01-26 DIAGNOSIS — I6200 Nontraumatic subdural hemorrhage, unspecified: Secondary | ICD-10-CM | POA: Diagnosis not present

## 2015-01-26 DIAGNOSIS — Z7951 Long term (current) use of inhaled steroids: Secondary | ICD-10-CM | POA: Diagnosis not present

## 2015-01-26 DIAGNOSIS — W19XXXA Unspecified fall, initial encounter: Secondary | ICD-10-CM | POA: Diagnosis not present

## 2015-01-26 DIAGNOSIS — Z7982 Long term (current) use of aspirin: Secondary | ICD-10-CM | POA: Diagnosis not present

## 2015-01-26 DIAGNOSIS — G919 Hydrocephalus, unspecified: Secondary | ICD-10-CM | POA: Diagnosis not present

## 2015-01-26 DIAGNOSIS — Z9181 History of falling: Secondary | ICD-10-CM | POA: Diagnosis not present

## 2015-01-26 DIAGNOSIS — S098XXA Other specified injuries of head, initial encounter: Secondary | ICD-10-CM | POA: Diagnosis not present

## 2015-01-26 DIAGNOSIS — S065X1A Traumatic subdural hemorrhage with loss of consciousness of 30 minutes or less, initial encounter: Secondary | ICD-10-CM | POA: Diagnosis not present

## 2015-01-26 DIAGNOSIS — Z880 Allergy status to penicillin: Secondary | ICD-10-CM | POA: Diagnosis not present

## 2015-01-26 DIAGNOSIS — Z79890 Hormone replacement therapy (postmenopausal): Secondary | ICD-10-CM | POA: Diagnosis not present

## 2015-01-26 DIAGNOSIS — R001 Bradycardia, unspecified: Secondary | ICD-10-CM | POA: Diagnosis not present

## 2015-01-26 DIAGNOSIS — S065X9A Traumatic subdural hemorrhage with loss of consciousness of unspecified duration, initial encounter: Secondary | ICD-10-CM | POA: Diagnosis present

## 2015-01-26 DIAGNOSIS — Z888 Allergy status to other drugs, medicaments and biological substances status: Secondary | ICD-10-CM | POA: Diagnosis not present

## 2015-01-26 DIAGNOSIS — R55 Syncope and collapse: Secondary | ICD-10-CM | POA: Diagnosis not present

## 2015-01-26 DIAGNOSIS — R1310 Dysphagia, unspecified: Secondary | ICD-10-CM | POA: Diagnosis present

## 2015-01-30 DIAGNOSIS — G5692 Unspecified mononeuropathy of left upper limb: Secondary | ICD-10-CM | POA: Diagnosis not present

## 2015-01-30 DIAGNOSIS — G912 (Idiopathic) normal pressure hydrocephalus: Secondary | ICD-10-CM | POA: Diagnosis not present

## 2015-01-30 DIAGNOSIS — G250 Essential tremor: Secondary | ICD-10-CM | POA: Diagnosis not present

## 2015-02-17 DIAGNOSIS — T7840XA Allergy, unspecified, initial encounter: Secondary | ICD-10-CM | POA: Diagnosis not present

## 2015-02-19 DIAGNOSIS — Z7982 Long term (current) use of aspirin: Secondary | ICD-10-CM | POA: Diagnosis not present

## 2015-02-19 DIAGNOSIS — R55 Syncope and collapse: Secondary | ICD-10-CM | POA: Diagnosis not present

## 2015-02-19 DIAGNOSIS — I6200 Nontraumatic subdural hemorrhage, unspecified: Secondary | ICD-10-CM | POA: Diagnosis not present

## 2015-02-24 DIAGNOSIS — R55 Syncope and collapse: Secondary | ICD-10-CM | POA: Insufficient documentation

## 2015-02-25 DIAGNOSIS — S065X9A Traumatic subdural hemorrhage with loss of consciousness of unspecified duration, initial encounter: Secondary | ICD-10-CM | POA: Diagnosis not present

## 2015-02-25 DIAGNOSIS — Z6830 Body mass index (BMI) 30.0-30.9, adult: Secondary | ICD-10-CM | POA: Diagnosis not present

## 2015-02-25 DIAGNOSIS — G912 (Idiopathic) normal pressure hydrocephalus: Secondary | ICD-10-CM | POA: Diagnosis not present

## 2015-03-17 DIAGNOSIS — L299 Pruritus, unspecified: Secondary | ICD-10-CM | POA: Diagnosis not present

## 2015-03-17 DIAGNOSIS — L300 Nummular dermatitis: Secondary | ICD-10-CM | POA: Diagnosis not present

## 2015-03-20 DIAGNOSIS — R55 Syncope and collapse: Secondary | ICD-10-CM | POA: Diagnosis not present

## 2015-03-20 DIAGNOSIS — R51 Headache: Secondary | ICD-10-CM | POA: Diagnosis not present

## 2015-03-20 DIAGNOSIS — N390 Urinary tract infection, site not specified: Secondary | ICD-10-CM | POA: Diagnosis not present

## 2015-03-20 DIAGNOSIS — S0101XA Laceration without foreign body of scalp, initial encounter: Secondary | ICD-10-CM | POA: Diagnosis not present

## 2015-03-20 DIAGNOSIS — I10 Essential (primary) hypertension: Secondary | ICD-10-CM | POA: Diagnosis not present

## 2015-03-20 DIAGNOSIS — S0990XA Unspecified injury of head, initial encounter: Secondary | ICD-10-CM | POA: Diagnosis not present

## 2015-03-20 DIAGNOSIS — M25511 Pain in right shoulder: Secondary | ICD-10-CM | POA: Diagnosis not present

## 2015-03-20 DIAGNOSIS — S51012A Laceration without foreign body of left elbow, initial encounter: Secondary | ICD-10-CM | POA: Diagnosis not present

## 2015-03-20 DIAGNOSIS — Z23 Encounter for immunization: Secondary | ICD-10-CM | POA: Diagnosis not present

## 2015-03-20 DIAGNOSIS — S299XXA Unspecified injury of thorax, initial encounter: Secondary | ICD-10-CM | POA: Diagnosis not present

## 2015-03-20 DIAGNOSIS — R7989 Other specified abnormal findings of blood chemistry: Secondary | ICD-10-CM | POA: Diagnosis not present

## 2015-04-10 DIAGNOSIS — N393 Stress incontinence (female) (male): Secondary | ICD-10-CM | POA: Diagnosis not present

## 2015-04-10 DIAGNOSIS — Q512 Other doubling of uterus: Secondary | ICD-10-CM | POA: Diagnosis not present

## 2015-04-10 DIAGNOSIS — B372 Candidiasis of skin and nail: Secondary | ICD-10-CM | POA: Diagnosis not present

## 2015-04-10 DIAGNOSIS — Z01419 Encounter for gynecological examination (general) (routine) without abnormal findings: Secondary | ICD-10-CM | POA: Diagnosis not present

## 2015-04-10 DIAGNOSIS — N951 Menopausal and female climacteric states: Secondary | ICD-10-CM | POA: Diagnosis not present

## 2015-04-30 DIAGNOSIS — J4530 Mild persistent asthma, uncomplicated: Secondary | ICD-10-CM | POA: Diagnosis not present

## 2015-04-30 DIAGNOSIS — Z6829 Body mass index (BMI) 29.0-29.9, adult: Secondary | ICD-10-CM | POA: Diagnosis not present

## 2015-04-30 DIAGNOSIS — I10 Essential (primary) hypertension: Secondary | ICD-10-CM | POA: Diagnosis not present

## 2015-04-30 DIAGNOSIS — E785 Hyperlipidemia, unspecified: Secondary | ICD-10-CM | POA: Diagnosis not present

## 2015-04-30 DIAGNOSIS — R600 Localized edema: Secondary | ICD-10-CM | POA: Diagnosis not present

## 2015-04-30 DIAGNOSIS — E042 Nontoxic multinodular goiter: Secondary | ICD-10-CM | POA: Diagnosis not present

## 2015-05-14 DIAGNOSIS — Z1231 Encounter for screening mammogram for malignant neoplasm of breast: Secondary | ICD-10-CM | POA: Diagnosis not present

## 2015-05-16 DIAGNOSIS — R2689 Other abnormalities of gait and mobility: Secondary | ICD-10-CM | POA: Diagnosis not present

## 2015-05-16 DIAGNOSIS — M6281 Muscle weakness (generalized): Secondary | ICD-10-CM | POA: Diagnosis not present

## 2015-05-19 DIAGNOSIS — R2689 Other abnormalities of gait and mobility: Secondary | ICD-10-CM | POA: Diagnosis not present

## 2015-05-19 DIAGNOSIS — M6281 Muscle weakness (generalized): Secondary | ICD-10-CM | POA: Diagnosis not present

## 2015-05-21 DIAGNOSIS — R2689 Other abnormalities of gait and mobility: Secondary | ICD-10-CM | POA: Diagnosis not present

## 2015-05-21 DIAGNOSIS — M6281 Muscle weakness (generalized): Secondary | ICD-10-CM | POA: Diagnosis not present

## 2015-05-26 DIAGNOSIS — R2689 Other abnormalities of gait and mobility: Secondary | ICD-10-CM | POA: Diagnosis not present

## 2015-05-26 DIAGNOSIS — M6281 Muscle weakness (generalized): Secondary | ICD-10-CM | POA: Diagnosis not present

## 2015-05-29 DIAGNOSIS — R2689 Other abnormalities of gait and mobility: Secondary | ICD-10-CM | POA: Diagnosis not present

## 2015-05-29 DIAGNOSIS — M6281 Muscle weakness (generalized): Secondary | ICD-10-CM | POA: Diagnosis not present

## 2015-06-02 DIAGNOSIS — J301 Allergic rhinitis due to pollen: Secondary | ICD-10-CM | POA: Insufficient documentation

## 2015-06-02 DIAGNOSIS — J4530 Mild persistent asthma, uncomplicated: Secondary | ICD-10-CM | POA: Diagnosis not present

## 2015-06-02 DIAGNOSIS — E042 Nontoxic multinodular goiter: Secondary | ICD-10-CM | POA: Diagnosis not present

## 2015-06-04 DIAGNOSIS — L02611 Cutaneous abscess of right foot: Secondary | ICD-10-CM | POA: Diagnosis not present

## 2015-06-10 DIAGNOSIS — M6281 Muscle weakness (generalized): Secondary | ICD-10-CM | POA: Diagnosis not present

## 2015-06-10 DIAGNOSIS — R2689 Other abnormalities of gait and mobility: Secondary | ICD-10-CM | POA: Diagnosis not present

## 2015-06-13 DIAGNOSIS — R2689 Other abnormalities of gait and mobility: Secondary | ICD-10-CM | POA: Diagnosis not present

## 2015-06-13 DIAGNOSIS — M6281 Muscle weakness (generalized): Secondary | ICD-10-CM | POA: Diagnosis not present

## 2015-06-17 DIAGNOSIS — M25561 Pain in right knee: Secondary | ICD-10-CM | POA: Diagnosis not present

## 2015-06-17 DIAGNOSIS — M1711 Unilateral primary osteoarthritis, right knee: Secondary | ICD-10-CM | POA: Diagnosis not present

## 2015-06-17 DIAGNOSIS — G8929 Other chronic pain: Secondary | ICD-10-CM | POA: Insufficient documentation

## 2015-06-18 DIAGNOSIS — M6281 Muscle weakness (generalized): Secondary | ICD-10-CM | POA: Diagnosis not present

## 2015-06-18 DIAGNOSIS — R2689 Other abnormalities of gait and mobility: Secondary | ICD-10-CM | POA: Diagnosis not present

## 2015-06-23 DIAGNOSIS — M6281 Muscle weakness (generalized): Secondary | ICD-10-CM | POA: Diagnosis not present

## 2015-06-23 DIAGNOSIS — R2689 Other abnormalities of gait and mobility: Secondary | ICD-10-CM | POA: Diagnosis not present

## 2015-07-03 DIAGNOSIS — R350 Frequency of micturition: Secondary | ICD-10-CM | POA: Diagnosis not present

## 2015-07-03 DIAGNOSIS — N3941 Urge incontinence: Secondary | ICD-10-CM | POA: Diagnosis not present

## 2015-07-03 DIAGNOSIS — N8111 Cystocele, midline: Secondary | ICD-10-CM | POA: Diagnosis not present

## 2015-08-05 DIAGNOSIS — M8589 Other specified disorders of bone density and structure, multiple sites: Secondary | ICD-10-CM | POA: Diagnosis not present

## 2015-08-05 DIAGNOSIS — I10 Essential (primary) hypertension: Secondary | ICD-10-CM | POA: Diagnosis not present

## 2015-08-05 DIAGNOSIS — R600 Localized edema: Secondary | ICD-10-CM | POA: Diagnosis not present

## 2015-08-05 DIAGNOSIS — J4530 Mild persistent asthma, uncomplicated: Secondary | ICD-10-CM | POA: Diagnosis not present

## 2015-08-05 DIAGNOSIS — E785 Hyperlipidemia, unspecified: Secondary | ICD-10-CM | POA: Diagnosis not present

## 2015-08-05 DIAGNOSIS — Z6829 Body mass index (BMI) 29.0-29.9, adult: Secondary | ICD-10-CM | POA: Diagnosis not present

## 2015-08-05 DIAGNOSIS — E042 Nontoxic multinodular goiter: Secondary | ICD-10-CM | POA: Diagnosis not present

## 2015-08-08 DIAGNOSIS — N898 Other specified noninflammatory disorders of vagina: Secondary | ICD-10-CM | POA: Diagnosis not present

## 2015-08-08 DIAGNOSIS — R3915 Urgency of urination: Secondary | ICD-10-CM | POA: Diagnosis not present

## 2015-08-08 DIAGNOSIS — N950 Postmenopausal bleeding: Secondary | ICD-10-CM | POA: Diagnosis not present

## 2015-08-11 DIAGNOSIS — Z13820 Encounter for screening for osteoporosis: Secondary | ICD-10-CM | POA: Diagnosis not present

## 2015-08-11 DIAGNOSIS — M8589 Other specified disorders of bone density and structure, multiple sites: Secondary | ICD-10-CM | POA: Diagnosis not present

## 2015-08-18 DIAGNOSIS — S81801A Unspecified open wound, right lower leg, initial encounter: Secondary | ICD-10-CM | POA: Diagnosis not present

## 2015-08-18 DIAGNOSIS — T148 Other injury of unspecified body region: Secondary | ICD-10-CM | POA: Diagnosis not present

## 2015-08-18 DIAGNOSIS — S81819A Laceration without foreign body, unspecified lower leg, initial encounter: Secondary | ICD-10-CM | POA: Diagnosis not present

## 2015-08-18 DIAGNOSIS — S81811A Laceration without foreign body, right lower leg, initial encounter: Secondary | ICD-10-CM | POA: Diagnosis not present

## 2015-08-19 DIAGNOSIS — R350 Frequency of micturition: Secondary | ICD-10-CM | POA: Diagnosis not present

## 2015-08-19 DIAGNOSIS — N8110 Cystocele, unspecified: Secondary | ICD-10-CM | POA: Diagnosis not present

## 2015-08-19 DIAGNOSIS — R339 Retention of urine, unspecified: Secondary | ICD-10-CM | POA: Diagnosis not present

## 2015-08-19 DIAGNOSIS — N3281 Overactive bladder: Secondary | ICD-10-CM | POA: Diagnosis not present

## 2015-08-19 DIAGNOSIS — N3941 Urge incontinence: Secondary | ICD-10-CM | POA: Diagnosis not present

## 2015-08-26 DIAGNOSIS — J329 Chronic sinusitis, unspecified: Secondary | ICD-10-CM | POA: Diagnosis not present

## 2015-08-26 DIAGNOSIS — Z79899 Other long term (current) drug therapy: Secondary | ICD-10-CM | POA: Diagnosis not present

## 2015-08-26 DIAGNOSIS — S81811A Laceration without foreign body, right lower leg, initial encounter: Secondary | ICD-10-CM | POA: Diagnosis not present

## 2015-08-26 DIAGNOSIS — Z6829 Body mass index (BMI) 29.0-29.9, adult: Secondary | ICD-10-CM | POA: Diagnosis not present

## 2015-09-02 DIAGNOSIS — J4530 Mild persistent asthma, uncomplicated: Secondary | ICD-10-CM | POA: Diagnosis not present

## 2015-09-02 DIAGNOSIS — J301 Allergic rhinitis due to pollen: Secondary | ICD-10-CM | POA: Diagnosis not present

## 2015-09-03 DIAGNOSIS — Z6829 Body mass index (BMI) 29.0-29.9, adult: Secondary | ICD-10-CM | POA: Diagnosis not present

## 2015-09-03 DIAGNOSIS — S81811S Laceration without foreign body, right lower leg, sequela: Secondary | ICD-10-CM | POA: Diagnosis not present

## 2015-09-03 DIAGNOSIS — M722 Plantar fascial fibromatosis: Secondary | ICD-10-CM | POA: Diagnosis not present

## 2015-09-03 DIAGNOSIS — E669 Obesity, unspecified: Secondary | ICD-10-CM | POA: Diagnosis not present

## 2015-09-08 ENCOUNTER — Other Ambulatory Visit: Payer: Self-pay

## 2015-09-16 DIAGNOSIS — R1314 Dysphagia, pharyngoesophageal phase: Secondary | ICD-10-CM | POA: Diagnosis not present

## 2015-09-16 DIAGNOSIS — K219 Gastro-esophageal reflux disease without esophagitis: Secondary | ICD-10-CM | POA: Diagnosis not present

## 2015-09-18 DIAGNOSIS — K222 Esophageal obstruction: Secondary | ICD-10-CM | POA: Diagnosis not present

## 2015-09-18 DIAGNOSIS — R1314 Dysphagia, pharyngoesophageal phase: Secondary | ICD-10-CM | POA: Diagnosis not present

## 2015-09-18 DIAGNOSIS — Z0000 Encounter for general adult medical examination without abnormal findings: Secondary | ICD-10-CM | POA: Diagnosis not present

## 2015-09-18 DIAGNOSIS — K317 Polyp of stomach and duodenum: Secondary | ICD-10-CM | POA: Diagnosis not present

## 2015-09-18 DIAGNOSIS — R1310 Dysphagia, unspecified: Secondary | ICD-10-CM | POA: Diagnosis not present

## 2015-11-12 DIAGNOSIS — E042 Nontoxic multinodular goiter: Secondary | ICD-10-CM | POA: Diagnosis not present

## 2015-11-12 DIAGNOSIS — E785 Hyperlipidemia, unspecified: Secondary | ICD-10-CM | POA: Diagnosis not present

## 2015-11-12 DIAGNOSIS — Z1389 Encounter for screening for other disorder: Secondary | ICD-10-CM | POA: Diagnosis not present

## 2015-11-12 DIAGNOSIS — Z23 Encounter for immunization: Secondary | ICD-10-CM | POA: Diagnosis not present

## 2015-11-12 DIAGNOSIS — I10 Essential (primary) hypertension: Secondary | ICD-10-CM | POA: Diagnosis not present

## 2015-11-12 DIAGNOSIS — Z9181 History of falling: Secondary | ICD-10-CM | POA: Diagnosis not present

## 2015-11-12 DIAGNOSIS — R600 Localized edema: Secondary | ICD-10-CM | POA: Diagnosis not present

## 2015-11-12 DIAGNOSIS — Z6830 Body mass index (BMI) 30.0-30.9, adult: Secondary | ICD-10-CM | POA: Diagnosis not present

## 2015-11-12 DIAGNOSIS — J4530 Mild persistent asthma, uncomplicated: Secondary | ICD-10-CM | POA: Diagnosis not present

## 2015-11-17 DIAGNOSIS — M1711 Unilateral primary osteoarthritis, right knee: Secondary | ICD-10-CM | POA: Diagnosis not present

## 2015-11-21 DIAGNOSIS — H9313 Tinnitus, bilateral: Secondary | ICD-10-CM | POA: Diagnosis not present

## 2015-11-21 DIAGNOSIS — H903 Sensorineural hearing loss, bilateral: Secondary | ICD-10-CM | POA: Diagnosis not present

## 2015-12-11 DIAGNOSIS — M1711 Unilateral primary osteoarthritis, right knee: Secondary | ICD-10-CM | POA: Diagnosis not present

## 2015-12-11 DIAGNOSIS — S8391XA Sprain of unspecified site of right knee, initial encounter: Secondary | ICD-10-CM | POA: Diagnosis not present

## 2015-12-22 DIAGNOSIS — M25561 Pain in right knee: Secondary | ICD-10-CM | POA: Diagnosis not present

## 2015-12-25 DIAGNOSIS — M1711 Unilateral primary osteoarthritis, right knee: Secondary | ICD-10-CM | POA: Diagnosis not present

## 2015-12-25 DIAGNOSIS — S83241A Other tear of medial meniscus, current injury, right knee, initial encounter: Secondary | ICD-10-CM | POA: Diagnosis not present

## 2015-12-31 ENCOUNTER — Ambulatory Visit: Payer: Medicare Other | Admitting: Sports Medicine

## 2016-01-01 ENCOUNTER — Ambulatory Visit (INDEPENDENT_AMBULATORY_CARE_PROVIDER_SITE_OTHER): Payer: Medicare Other

## 2016-01-01 ENCOUNTER — Ambulatory Visit (INDEPENDENT_AMBULATORY_CARE_PROVIDER_SITE_OTHER): Payer: Medicare Other | Admitting: Sports Medicine

## 2016-01-01 ENCOUNTER — Encounter: Payer: Self-pay | Admitting: Sports Medicine

## 2016-01-01 DIAGNOSIS — M19071 Primary osteoarthritis, right ankle and foot: Secondary | ICD-10-CM | POA: Diagnosis not present

## 2016-01-01 DIAGNOSIS — M79671 Pain in right foot: Secondary | ICD-10-CM

## 2016-01-01 DIAGNOSIS — S93601A Unspecified sprain of right foot, initial encounter: Secondary | ICD-10-CM | POA: Diagnosis not present

## 2016-01-01 DIAGNOSIS — M729 Fibroblastic disorder, unspecified: Secondary | ICD-10-CM

## 2016-01-01 DIAGNOSIS — M722 Plantar fascial fibromatosis: Secondary | ICD-10-CM | POA: Diagnosis not present

## 2016-01-01 DIAGNOSIS — M779 Enthesopathy, unspecified: Secondary | ICD-10-CM

## 2016-01-01 NOTE — Progress Notes (Signed)
Subjective: Shanekqua Comas is a 75 y.o. female patient who presents to office for evaluation of right foot pain. Patient complains of pain after taking a misstep while going to check the mailbox which happened a couple weeks ago. States that she also hurt her knee. Her knee hurts worse states that she saw her orthopedic doctor who started her on Advil with some relief. However, wanted to have her foot checked. Patient states that her foot bothers her most with long periods of standing or walking. Patient has tried rest, heat and ice with some improvement. However, still bothersome. Patient denies any other pedal complaints.   Patient Active Problem List   Diagnosis Date Noted  . Chronic pain of right knee 06/17/2015  . Mild persistent asthma without complication [id number]  . Seasonal allergic rhinitis due to pollen 06/02/2015  . Syncope and collapse 02/24/2015  . Allergic rhinitis 09/07/2013  . Allergic rhinitis due to pollen 09/07/2013  . Asthma 09/07/2013  . Candidiasis of mouth 09/07/2013  . Capsulitis of foot, left 09/07/2013  . Capsulitis of foot, right 09/07/2013  . Cough 09/07/2013  . Cystocele 09/07/2013  . 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 Diastolic dysfunction [id number]  . Edema 09/07/2013  . Effusion of foot 09/07/2013  . Enthesopathy of ankle and tarsus 09/07/2013  . Esophageal reflux disease 09/07/2013  . Headache 09/07/2013  . Hoarseness 09/07/2013  . Hypertension 09/07/2013  . Hyperthyroidism 09/07/2013  . Hypoxemia 09/07/2013  . Incomplete emptying of bladder 09/07/2013  . Metatarsalgia of left foot 09/07/2013  . Metatarsalgia of right foot 09/07/2013  . Neuralgia and neuritis 09/07/2013  . Other symptoms involving urinary system(788.99) 09/07/2013  . Peripheral neuropathy ([place]) 09/07/2013  . Plantar fasciitis 09/07/2013  . Postnasal drip 09/07/2013  . Sensorineural hearing loss (SNHL) of both ears 09/07/2013  . Simple goiter 09/07/2013  . Symptomatic menopausal  or female climacteric states 09/07/2013  . Tinnitus of both ears 09/07/2013  . Urge incontinence of urine 09/07/2013  . Increased frequency of urination 09/07/2013  . Urinary urgency 09/07/2013  . Vaginitis 09/07/2013  . Vulvar itching 09/07/2013  . Essential tremor 12/19/2012  . 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 Acute blood loss anemia 10/17/2011  . Dysphagia 05/18/2011  . Dysphonia 05/18/2011  . Bradycardia 01/08/2011  . Shortness of breath 08/29/2009    Current Outpatient Prescriptions on File Prior to Visit  Medication Sig Dispense Refill  . beclomethasone (QVAR) 80 MCG/ACT inhaler Inhale 2 puffs into the lungs 2 (two) times daily.    . calcium-vitamin D (OSCAL WITH D) 500-200 MG-UNIT per tablet Take 1 tablet by mouth 2 (two) times daily.    Marland Kitchen docusate sodium 100 MG CAPS Take 100 mg by mouth 2 (two) times daily. 30 capsule 0  . enoxaparin (LOVENOX) 30 MG/0.3ML injection Inject 0.3 mLs (30 mg total) into the skin every 12 (twelve) hours. 22 Syringe 0  . estrogens, conjugated, (PREMARIN) 0.3 MG tablet Take 0.3 mg by mouth daily. Take daily for 21 days then do not take for 7 days.     . fish oil-omega-3 fatty acids 1000 MG capsule Take 1 g by mouth 2 (two) times daily.    . furosemide (LASIX) 40 MG tablet Take 40 mg by mouth daily.    Marland Kitchen gabapentin (NEURONTIN) 300 MG capsule Take 300 mg by mouth 2 (two) times daily.    Marland Kitchen levothyroxine (SYNTHROID, LEVOTHROID) 50 MCG tablet Take 50 mcg by mouth daily.    Marland Kitchen lisinopril (PRINIVIL,ZESTRIL) 40 MG tablet Take 40  mg by mouth daily.      Marland Kitchen loratadine-pseudoephedrine (CLARITIN-D 24-HOUR) 10-240 MG per 24 hr tablet Take 1 tablet by mouth daily.      . methocarbamol (ROBAXIN) 500 MG tablet Take 1 tablet (500 mg total) by mouth 4 (four) times daily. As needed for spasm 60 tablet 0  . Multiple Vitamins-Minerals (THERATRUM COMPLETE) TABS Take 1 tablet by mouth daily.      Marland Kitchen omeprazole (PRILOSEC) 20 MG capsule Take 20 mg by mouth 2  (two) times daily.      Marland Kitchen oxyCODONE-acetaminophen (ROXICET) 5-325 MG per tablet 1-2 tabs po q4-6hrs prn pain 90 tablet 0  . potassium chloride SA (K-DUR,KLOR-CON) 20 MEQ tablet Take 10 mEq by mouth daily.     . propranolol (INDERAL) 20 MG tablet TAKE 1 TABLET ONCE DAILY 90 tablet 3  . simvastatin (ZOCOR) 40 MG tablet Take 40 mg by mouth at bedtime.      . solifenacin (VESICARE) 5 MG tablet Take 5 mg by mouth daily.     . Thiamine HCl (VITAMIN B-1 PO) Take 1 tablet by mouth daily.       No current facility-administered medications on file prior to visit.     Allergies  Allergen Reactions  . Oxybutynin Chloride Anaphylaxis and Other (See Comments)    Throat Swelling Throat Swelling Per documents sent over - patient denies  . Azithromycin Rash and Hives  . Emetrol Rash  . Penicillins Diarrhea and Nausea And Vomiting  . Petrolatum-Zinc Oxide Rash    Throat Swelling Throat Swelling Throat Swelling Throat Swelling Throat Swelling  . Sulfa Antibiotics Rash  . Ampicillin Other (See Comments)    Hands tingle and hurt  . Levofloxacin Nausea Only    Objective:  General: Alert and oriented x3 in no acute distress  Dermatology: No open lesions bilateral lower extremities, no webspace macerations, no ecchymosis bilateral, all nails x 10 are short and thick but well manicured.  Vascular: Dorsalis Pedis and Posterior Tibial pedal pulses palpable 1 out of 4, Capillary Fill Time 3 seconds, scant pedal hair growth bilateral, no edema bilateral lower extremities, Temperature gradient within normal limits.  Neurology: Johney Maine sensation intact via light touch bilateral. (- )Tinels sign bilateral.   Musculoskeletal: Mild tenderness with palpation at right heel, midfoot, and 3-4 metatarsals distal midshaft,No pain with calf compression bilateral.  Strength within normal limits in all groups bilateral.   Gait: Non-Antalgic gait  Xrays  Right Foot   Impression:Normal osseous mineralization. There  is diffuse signs of arthritis across the midtarsal joint. Ankle, there is inferior calcaneal spur with calcification extending to the plantar fascial with avulsion of spur. There is significant pes planus deformity with midtarsal breach and hammertoe deformity. No fractures, soft tissue margins within normal limits.  Assessment and Plan: Problem List Items Addressed This Visit    None    Visit Diagnoses    Sprain of right foot, initial encounter    -  Primary   Right foot pain       Relevant Orders   DG Foot 2 Views Right   Tendonitis       Fasciitis       Arthritis of right foot           -Complete examination performed -Xrays reviewed -Discussed treatement options for likely sprain with fasciitis and arthritis -Continue with Advil -Dispensed fascial brace and instructed on use -Recommend protection, ice, elevation, and rest until symptoms improve -Patient to return to office as needed or  sooner if condition worsens.  Landis Martins, DPM

## 2016-01-15 ENCOUNTER — Other Ambulatory Visit: Payer: Self-pay | Admitting: Orthopedic Surgery

## 2016-01-21 ENCOUNTER — Telehealth: Payer: Self-pay | Admitting: *Deleted

## 2016-01-21 NOTE — Telephone Encounter (Addendum)
Pt states call so she can explain about her foot problem. I spoke with pt and she states she is wearing the brace and doing every thing Dr. Cannon Kettle wanted her to do but her foot is not getting any better. I reviewed pt's LOV and Dr. Cannon Kettle had wanted pt to return to office if not improving. I instructed pt to continue RICE and I would have a scheduler contact her tomorrow to schedule an appt. Pt states understanding. 01/23/2016-Pt called states she called earlier this week and no one returned her call. I left message informing pt that I had spoken to her 01/21/2016 at 5:14pm and if she had other questions to call again.

## 2016-01-23 ENCOUNTER — Ambulatory Visit (INDEPENDENT_AMBULATORY_CARE_PROVIDER_SITE_OTHER): Payer: Medicare Other | Admitting: Sports Medicine

## 2016-01-23 ENCOUNTER — Encounter: Payer: Self-pay | Admitting: Sports Medicine

## 2016-01-23 DIAGNOSIS — M79671 Pain in right foot: Secondary | ICD-10-CM

## 2016-01-23 DIAGNOSIS — M729 Fibroblastic disorder, unspecified: Secondary | ICD-10-CM

## 2016-01-23 DIAGNOSIS — M779 Enthesopathy, unspecified: Secondary | ICD-10-CM

## 2016-01-23 DIAGNOSIS — S93601D Unspecified sprain of right foot, subsequent encounter: Secondary | ICD-10-CM

## 2016-01-23 MED ORDER — METHYLPREDNISOLONE 4 MG PO TBPK
ORAL_TABLET | ORAL | 0 refills | Status: DC
Start: 2016-01-23 — End: 2016-03-02

## 2016-01-23 NOTE — Patient Instructions (Signed)
Wear post op shoe for 1 week then return to using SAS tennis shoes Each day you may use heat to foot and then follow with using ice for 5 minutes Make sure to take your steroid dose pack as instructed  Return to office in 2 weeks and bring brace with you

## 2016-01-23 NOTE — Progress Notes (Signed)
Subjective: Rachel Green is a 76 y.o. female patient who returns to office as a walk in for right foot pain, states that her foot hurts, she has been wearing brace, using heat and ice and taking Advil with no improvement. States it hurts with the brace and without the brace States that she wants her foot better by next month because she is scheduled for knee replacement on right. Patient denies any other pedal complaints.   Patient Active Problem List   Diagnosis Date Noted  . Chronic pain of right knee 06/17/2015  . Mild persistent asthma without complication [id number]  . Seasonal allergic rhinitis due to pollen 06/02/2015  . Syncope and collapse 02/24/2015  . Allergic rhinitis 09/07/2013  . Allergic rhinitis due to pollen 09/07/2013  . Asthma 09/07/2013  . Candidiasis of mouth 09/07/2013  . Capsulitis of foot, left 09/07/2013  . Capsulitis of foot, right 09/07/2013  . Cough 09/07/2013  . Cystocele 09/07/2013  . 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 Diastolic dysfunction Q000111Q  . Edema 09/07/2013  . Effusion of foot 09/07/2013  . Enthesopathy of ankle and tarsus 09/07/2013  . Esophageal reflux disease 09/07/2013  . Headache 09/07/2013  . Hoarseness 09/07/2013  . Hypertension 09/07/2013  . Hyperthyroidism 09/07/2013  . Hypoxemia 09/07/2013  . Incomplete emptying of bladder 09/07/2013  . Metatarsalgia of left foot 09/07/2013  . Metatarsalgia of right foot 09/07/2013  . Neuralgia and neuritis 09/07/2013  . Other symptoms involving urinary system(788.99) 09/07/2013  . Peripheral neuropathy ([place]) 09/07/2013  . Plantar fasciitis 09/07/2013  . Postnasal drip 09/07/2013  . Sensorineural hearing loss (SNHL) of both ears 09/07/2013  . Simple goiter 09/07/2013  . Symptomatic menopausal or female climacteric states 09/07/2013  . Tinnitus of both ears 09/07/2013  . Urge incontinence of urine 09/07/2013  . Increased frequency of urination 09/07/2013  . Urinary urgency 09/07/2013  .  Vaginitis 09/07/2013  . Vulvar itching 09/07/2013  . Essential tremor 12/19/2012  . 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 Acute blood loss anemia 10/17/2011  . Dysphagia 05/18/2011  . Dysphonia 05/18/2011  . Bradycardia 01/08/2011  . Shortness of breath 08/29/2009    Current Outpatient Prescriptions on File Prior to Visit  Medication Sig Dispense Refill  . beclomethasone (QVAR) 80 MCG/ACT inhaler Inhale 2 puffs into the lungs 2 (two) times daily.    . calcium-vitamin D (OSCAL WITH D) 500-200 MG-UNIT per tablet Take 1 tablet by mouth 2 (two) times daily.    Marland Kitchen docusate sodium 100 MG CAPS Take 100 mg by mouth 2 (two) times daily. 30 capsule 0  . enoxaparin (LOVENOX) 30 MG/0.3ML injection Inject 0.3 mLs (30 mg total) into the skin every 12 (twelve) hours. 22 Syringe 0  . estrogens, conjugated, (PREMARIN) 0.3 MG tablet Take 0.3 mg by mouth daily. Take daily for 21 days then do not take for 7 days.     . fish oil-omega-3 fatty acids 1000 MG capsule Take 1 g by mouth 2 (two) times daily.    . furosemide (LASIX) 40 MG tablet Take 40 mg by mouth daily.    Marland Kitchen gabapentin (NEURONTIN) 300 MG capsule Take 300 mg by mouth 2 (two) times daily.    Marland Kitchen levothyroxine (SYNTHROID, LEVOTHROID) 50 MCG tablet Take 50 mcg by mouth daily.    Marland Kitchen lisinopril (PRINIVIL,ZESTRIL) 40 MG tablet Take 40 mg by mouth daily.      Marland Kitchen loratadine-pseudoephedrine (CLARITIN-D 24-HOUR) 10-240 MG per 24 hr tablet Take 1 tablet by mouth daily.      Marland Kitchen  methocarbamol (ROBAXIN) 500 MG tablet Take 1 tablet (500 mg total) by mouth 4 (four) times daily. As needed for spasm 60 tablet 0  . Multiple Vitamins-Minerals (THERATRUM COMPLETE) TABS Take 1 tablet by mouth daily.      Marland Kitchen omeprazole (PRILOSEC) 20 MG capsule Take 20 mg by mouth 2 (two) times daily.      Marland Kitchen oxyCODONE-acetaminophen (ROXICET) 5-325 MG per tablet 1-2 tabs po q4-6hrs prn pain 90 tablet 0  . potassium chloride SA (K-DUR,KLOR-CON) 20 MEQ tablet Take 10 mEq by mouth  daily.     . propranolol (INDERAL) 20 MG tablet TAKE 1 TABLET ONCE DAILY 90 tablet 3  . simvastatin (ZOCOR) 40 MG tablet Take 40 mg by mouth at bedtime.      . solifenacin (VESICARE) 5 MG tablet Take 5 mg by mouth daily.     . Thiamine HCl (VITAMIN B-1 PO) Take 1 tablet by mouth daily.       No current facility-administered medications on file prior to visit.     Allergies  Allergen Reactions  . Oxybutynin Chloride Anaphylaxis and Other (See Comments)    Throat Swelling Throat Swelling Per documents sent over - patient denies  . Azithromycin Rash and Hives  . Emetrol Rash  . Penicillins Diarrhea and Nausea And Vomiting  . Petrolatum-Zinc Oxide Rash    Throat Swelling Throat Swelling Throat Swelling Throat Swelling Throat Swelling  . Sulfa Antibiotics Rash  . Ampicillin Other (See Comments)    Hands tingle and hurt  . Levofloxacin Nausea Only    Objective:  General: Alert and oriented x3 in no acute distress  Dermatology: No open lesions bilateral lower extremities, no webspace macerations, no ecchymosis bilateral, all nails x 10 are short and thick but well manicured.  Vascular: Dorsalis Pedis and Posterior Tibial pedal pulses palpable 1 out of 4, Capillary Fill Time 3 seconds, scant pedal hair growth bilateral, no edema bilateral lower extremities, Temperature gradient within normal limits.  Neurology: Johney Maine sensation intact via light touch bilateral. (- )Tinels sign bilateral.   Musculoskeletal: There is tenderness with palpation at right heel>midfoot> and 3-4 metatarsals distal midshaft,No pain with calf compression bilateral.  Strength within normal limits in all groups bilateral.   Assessment and Plan: Problem List Items Addressed This Visit    None    Visit Diagnoses    Fasciitis    -  Primary   Relevant Medications   methylPREDNISolone (MEDROL DOSEPAK) 4 MG TBPK tablet   Tendonitis       Relevant Medications   methylPREDNISolone (MEDROL DOSEPAK) 4 MG TBPK  tablet   Sprain of right foot, subsequent encounter       Relevant Medications   methylPREDNISolone (MEDROL DOSEPAK) 4 MG TBPK tablet   Right foot pain       Relevant Medications   methylPREDNISolone (MEDROL DOSEPAK) 4 MG TBPK tablet       -Complete examination performed -Previous Xrays reviewed -Discussed treatement options for likely sprain with fasciitis and arthritis -After oral consent and aseptic prep, injected a mixture containing 1 ml of 2% plain lidocaine, 1 ml 0.5% plain marcaine, 0.5 ml of kenalog 40 and 0.5 ml of dexamethasone phosphate into right heel without complication. Post-injection care discussed with patient.  -Rx Medrol dose pak -Dispensed post op shoe to use for 1 week and then to return to SAS tennis shoes -Continue with Advil as needed -Discontinue use of fascial brace until next visit -Recommend protection, ice, elevation, and rest until symptoms improve. Advise  patient if she uses heat must follow by using ice to the area.  -Patient to return to office in 2 weeks or sooner if condition worsens.  Landis Martins, DPM

## 2016-01-28 ENCOUNTER — Ambulatory Visit: Payer: Medicare Other | Admitting: Sports Medicine

## 2016-02-06 ENCOUNTER — Ambulatory Visit (INDEPENDENT_AMBULATORY_CARE_PROVIDER_SITE_OTHER): Payer: Medicare Other | Admitting: Sports Medicine

## 2016-02-06 ENCOUNTER — Encounter: Payer: Self-pay | Admitting: Sports Medicine

## 2016-02-06 DIAGNOSIS — M729 Fibroblastic disorder, unspecified: Secondary | ICD-10-CM | POA: Diagnosis not present

## 2016-02-06 DIAGNOSIS — S93601D Unspecified sprain of right foot, subsequent encounter: Secondary | ICD-10-CM | POA: Diagnosis not present

## 2016-02-06 DIAGNOSIS — M779 Enthesopathy, unspecified: Secondary | ICD-10-CM | POA: Diagnosis not present

## 2016-02-06 DIAGNOSIS — M79671 Pain in right foot: Secondary | ICD-10-CM | POA: Diagnosis not present

## 2016-02-06 NOTE — Progress Notes (Signed)
Subjective: Rachel Green is a 76 y.o. female patient who returns to office for follow up on right foot pain, states that her foot feels great after injection and steriods. States that pain is pretty much gone. Scheduled for Right TKA on 03-01-16. Patient denies any other pedal complaints.   Patient Active Problem List   Diagnosis Date Noted  . Chronic pain of right knee 06/17/2015  . Mild persistent asthma without complication [id number]  . Seasonal allergic rhinitis due to pollen 06/02/2015  . Syncope and collapse 02/24/2015  . Allergic rhinitis 09/07/2013  . Allergic rhinitis due to pollen 09/07/2013  . Asthma 09/07/2013  . Candidiasis of mouth 09/07/2013  . Capsulitis of foot, left 09/07/2013  . Capsulitis of foot, right 09/07/2013  . Cough 09/07/2013  . Cystocele 09/07/2013  . 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 Diastolic dysfunction Q000111Q  . Edema 09/07/2013  . Effusion of foot 09/07/2013  . Enthesopathy of ankle and tarsus 09/07/2013  . Esophageal reflux disease 09/07/2013  . Headache 09/07/2013  . Hoarseness 09/07/2013  . Hypertension 09/07/2013  . Hyperthyroidism 09/07/2013  . Hypoxemia 09/07/2013  . Incomplete emptying of bladder 09/07/2013  . Metatarsalgia of left foot 09/07/2013  . Metatarsalgia of right foot 09/07/2013  . Neuralgia and neuritis 09/07/2013  . Other symptoms involving urinary system(788.99) 09/07/2013  . Peripheral neuropathy ([place]) 09/07/2013  . Plantar fasciitis 09/07/2013  . Postnasal drip 09/07/2013  . Sensorineural hearing loss (SNHL) of both ears 09/07/2013  . Simple goiter 09/07/2013  . Symptomatic menopausal or female climacteric states 09/07/2013  . Tinnitus of both ears 09/07/2013  . Urge incontinence of urine 09/07/2013  . Increased frequency of urination 09/07/2013  . Urinary urgency 09/07/2013  . Vaginitis 09/07/2013  . Vulvar itching 09/07/2013  . Essential tremor 12/19/2012  . 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  Acute blood loss anemia 10/17/2011  . Dysphagia 05/18/2011  . Dysphonia 05/18/2011  . Bradycardia 01/08/2011  . Shortness of breath 08/29/2009    Current Outpatient Prescriptions on File Prior to Visit  Medication Sig Dispense Refill  . beclomethasone (QVAR) 80 MCG/ACT inhaler Inhale 2 puffs into the lungs 2 (two) times daily.    . calcium-vitamin D (OSCAL WITH D) 500-200 MG-UNIT per tablet Take 1 tablet by mouth 2 (two) times daily.    Marland Kitchen docusate sodium 100 MG CAPS Take 100 mg by mouth 2 (two) times daily. 30 capsule 0  . enoxaparin (LOVENOX) 30 MG/0.3ML injection Inject 0.3 mLs (30 mg total) into the skin every 12 (twelve) hours. 22 Syringe 0  . estrogens, conjugated, (PREMARIN) 0.3 MG tablet Take 0.3 mg by mouth daily. Take daily for 21 days then do not take for 7 days.     . fish oil-omega-3 fatty acids 1000 MG capsule Take 1 g by mouth 2 (two) times daily.    . furosemide (LASIX) 40 MG tablet Take 40 mg by mouth daily.    Marland Kitchen gabapentin (NEURONTIN) 300 MG capsule Take 300 mg by mouth 2 (two) times daily.    Marland Kitchen levothyroxine (SYNTHROID, LEVOTHROID) 50 MCG tablet Take 50 mcg by mouth daily.    Marland Kitchen lisinopril (PRINIVIL,ZESTRIL) 40 MG tablet Take 40 mg by mouth daily.      Marland Kitchen loratadine-pseudoephedrine (CLARITIN-D 24-HOUR) 10-240 MG per 24 hr tablet Take 1 tablet by mouth daily.      . methocarbamol (ROBAXIN) 500 MG tablet Take 1 tablet (500 mg total) by mouth 4 (four) times daily. As needed for spasm 60 tablet 0  .  methylPREDNISolone (MEDROL DOSEPAK) 4 MG TBPK tablet Take as instructed 21 tablet 0  . Multiple Vitamins-Minerals (THERATRUM COMPLETE) TABS Take 1 tablet by mouth daily.      Marland Kitchen omeprazole (PRILOSEC) 20 MG capsule Take 20 mg by mouth 2 (two) times daily.      Marland Kitchen oxyCODONE-acetaminophen (ROXICET) 5-325 MG per tablet 1-2 tabs po q4-6hrs prn pain 90 tablet 0  . potassium chloride SA (K-DUR,KLOR-CON) 20 MEQ tablet Take 10 mEq by mouth daily.     . propranolol (INDERAL) 20 MG tablet TAKE 1  TABLET ONCE DAILY 90 tablet 3  . simvastatin (ZOCOR) 40 MG tablet Take 40 mg by mouth at bedtime.      . solifenacin (VESICARE) 5 MG tablet Take 5 mg by mouth daily.     . Thiamine HCl (VITAMIN B-1 PO) Take 1 tablet by mouth daily.       No current facility-administered medications on file prior to visit.     Allergies  Allergen Reactions  . Oxybutynin Chloride Anaphylaxis and Other (See Comments)    Throat Swelling Throat Swelling Per documents sent over - patient denies  . Azithromycin Rash and Hives  . Emetrol Rash  . Penicillins Diarrhea and Nausea And Vomiting  . Petrolatum-Zinc Oxide Rash    Throat Swelling Throat Swelling Throat Swelling Throat Swelling Throat Swelling  . Sulfa Antibiotics Rash  . Ampicillin Other (See Comments)    Hands tingle and hurt  . Levofloxacin Nausea Only    Objective:  General: Alert and oriented x3 in no acute distress  Dermatology: No open lesions bilateral lower extremities, no webspace macerations, no ecchymosis bilateral, all nails x 10 are short and thick but well manicured.  Vascular: Dorsalis Pedis and Posterior Tibial pedal pulses palpable 1 out of 4, Capillary Fill Time 3 seconds, scant pedal hair growth bilateral, no edema bilateral lower extremities, Temperature gradient within normal limits.  Neurology: Johney Maine sensation intact via light touch bilateral. (- )Tinels sign bilateral.   Musculoskeletal: There is no tenderness with palpation at right heel>midfoot> and 3-4 metatarsals distal midshaft,No pain with calf compression bilateral.  Strength within normal limits in all groups bilateral.   Assessment and Plan: Problem List Items Addressed This Visit    None    Visit Diagnoses    Fasciitis    -  Primary   Tendonitis       Sprain of right foot, subsequent encounter       Right foot pain           -Complete examination performed -Discussed continued care for fasciitis, tendonitis, sprain -Recommend  monitoring -Recommend continue with good supportive shoes -Recommend protection, ice, elevation, and rest to prevent recurrence. Advise patient if she uses heat must follow by using ice to the area.  -Patient to return to office as needed or sooner if condition worsens.  Landis Martins, DPM

## 2016-02-12 DIAGNOSIS — Z6830 Body mass index (BMI) 30.0-30.9, adult: Secondary | ICD-10-CM | POA: Diagnosis not present

## 2016-02-12 DIAGNOSIS — J4530 Mild persistent asthma, uncomplicated: Secondary | ICD-10-CM | POA: Diagnosis not present

## 2016-02-12 DIAGNOSIS — E042 Nontoxic multinodular goiter: Secondary | ICD-10-CM | POA: Diagnosis not present

## 2016-02-12 DIAGNOSIS — R600 Localized edema: Secondary | ICD-10-CM | POA: Diagnosis not present

## 2016-02-12 DIAGNOSIS — M1711 Unilateral primary osteoarthritis, right knee: Secondary | ICD-10-CM | POA: Diagnosis not present

## 2016-02-12 DIAGNOSIS — I10 Essential (primary) hypertension: Secondary | ICD-10-CM | POA: Diagnosis not present

## 2016-02-12 DIAGNOSIS — E785 Hyperlipidemia, unspecified: Secondary | ICD-10-CM | POA: Diagnosis not present

## 2016-02-19 ENCOUNTER — Other Ambulatory Visit (HOSPITAL_COMMUNITY): Payer: Medicare Other

## 2016-02-19 ENCOUNTER — Encounter (HOSPITAL_COMMUNITY): Payer: Self-pay

## 2016-02-19 NOTE — Pre-Procedure Instructions (Signed)
    Rachel Green  02/19/2016      [facility], [facility], [place] [address] Phone: [phone] Fax: [facility] [address], [place] [id number] [place] [id number] Phone: [phone] Fax: [phone]    Your procedure is scheduled on Mon. Feb. 19  Report to [facility] Admitting at 5:30 A.M.  Call this number if you have problems the morning of surgery:  [phone]   Remember:  Do not eat food or drink liquids after midnight on Mon. Feb. 18   Take these medicines the morning of surgery with A SIP OF WATER : qvar-bring to hospital, levothyroxine(synthroid),omeprazole(prilosec),paroxetine(paxil) inderal (propranolol) myrbetriq             1 week prior to surgery stop aspirin, advil, motrin, ibuprofen, aleve, BC Powders, goody's, fish oil, vitamins/herbal medicines   Do not wear jewelry, make-up or nail polish.  Do not wear lotions, powders, or perfumes, or deoderant.  Do not shave 48 hours prior to surgery.  Men may shave face and neck.  Do not bring valuables to the hospital.  [facility] is not responsible for any belongings or valuables.  Contacts, dentures or bridgework may not be worn into surgery.  Leave your suitcase in the car.  After surgery it may be brought to your room.  For patients admitted to the hospital, discharge time will be determined by your treatment team.  Patients discharged the day of surgery will not be allowed to drive home.   Special instructions:  Review preparing for surgery  Please read over the following fact sheets that you were given. Coughing and Deep Breathing, Total Joint Packet and MRSA Information

## 2016-02-20 ENCOUNTER — Encounter (HOSPITAL_COMMUNITY): Payer: Self-pay

## 2016-02-20 ENCOUNTER — Encounter (HOSPITAL_COMMUNITY)
Admission: RE | Admit: 2016-02-20 | Discharge: 2016-02-20 | Disposition: A | Discharge status: Home / Self Care | Payer: Medicare Other | Source: Ambulatory Visit | Attending: Orthopedic Surgery | Admitting: Orthopedic Surgery

## 2016-02-20 DIAGNOSIS — Z9981 Dependence on supplemental oxygen: Secondary | ICD-10-CM | POA: Insufficient documentation

## 2016-02-20 DIAGNOSIS — Z7951 Long term (current) use of inhaled steroids: Secondary | ICD-10-CM | POA: Diagnosis not present

## 2016-02-20 DIAGNOSIS — Z96652 Presence of left artificial knee joint: Secondary | ICD-10-CM | POA: Diagnosis not present

## 2016-02-20 DIAGNOSIS — I10 Essential (primary) hypertension: Secondary | ICD-10-CM | POA: Diagnosis not present

## 2016-02-20 DIAGNOSIS — Z85828 Personal history of other malignant neoplasm of skin: Secondary | ICD-10-CM | POA: Insufficient documentation

## 2016-02-20 DIAGNOSIS — Z79899 Other long term (current) drug therapy: Secondary | ICD-10-CM | POA: Insufficient documentation

## 2016-02-20 DIAGNOSIS — G629 Polyneuropathy, unspecified: Secondary | ICD-10-CM | POA: Insufficient documentation

## 2016-02-20 DIAGNOSIS — Z01812 Encounter for preprocedural laboratory examination: Secondary | ICD-10-CM | POA: Diagnosis not present

## 2016-02-20 DIAGNOSIS — K219 Gastro-esophageal reflux disease without esophagitis: Secondary | ICD-10-CM | POA: Insufficient documentation

## 2016-02-20 DIAGNOSIS — M1711 Unilateral primary osteoarthritis, right knee: Secondary | ICD-10-CM | POA: Insufficient documentation

## 2016-02-20 DIAGNOSIS — J45909 Unspecified asthma, uncomplicated: Secondary | ICD-10-CM | POA: Diagnosis not present

## 2016-02-20 DIAGNOSIS — E039 Hypothyroidism, unspecified: Secondary | ICD-10-CM | POA: Insufficient documentation

## 2016-02-20 DIAGNOSIS — Z7982 Long term (current) use of aspirin: Secondary | ICD-10-CM | POA: Diagnosis not present

## 2016-02-20 DIAGNOSIS — Z01818 Encounter for other preprocedural examination: Secondary | ICD-10-CM | POA: Diagnosis not present

## 2016-02-20 LAB — CBC WITH DIFFERENTIAL/PLATELET
BASOS ABS: 0 10*3/uL (ref 0.0–0.1)
Basophils Relative: 0 %
Eosinophils Absolute: 0.3 10*3/uL (ref 0.0–0.7)
Eosinophils Relative: 3 %
HEMATOCRIT: 36.2 % (ref 36.0–46.0)
Hemoglobin: 11.6 g/dL — ABNORMAL LOW (ref 12.0–15.0)
LYMPHS PCT: 34 %
Lymphs Abs: 3.4 10*3/uL (ref 0.7–4.0)
MCH: 29.2 pg (ref 26.0–34.0)
MCHC: 32 g/dL (ref 30.0–36.0)
MCV: 91.2 fL (ref 78.0–100.0)
MONO ABS: 1 10*3/uL (ref 0.1–1.0)
MONOS PCT: 9 %
NEUTROS ABS: 5.5 10*3/uL (ref 1.7–7.7)
Neutrophils Relative %: 54 %
Platelets: 225 10*3/uL (ref 150–400)
RBC: 3.97 MIL/uL (ref 3.87–5.11)
RDW: 14.9 % (ref 11.5–15.5)
WBC: 10.2 10*3/uL (ref 4.0–10.5)

## 2016-02-20 LAB — COMPREHENSIVE METABOLIC PANEL
ALT: 15 U/L (ref 14–54)
AST: 24 U/L (ref 15–41)
Albumin: 3.2 g/dL — ABNORMAL LOW (ref 3.5–5.0)
Alkaline Phosphatase: 78 U/L (ref 38–126)
Anion gap: 9 (ref 5–15)
BILIRUBIN TOTAL: 0.4 mg/dL (ref 0.3–1.2)
BUN: 15 mg/dL (ref 6–20)
CALCIUM: 9.2 mg/dL (ref 8.9–10.3)
CO2: 27 mmol/L (ref 22–32)
CREATININE: 1.2 mg/dL — AB (ref 0.44–1.00)
Chloride: 102 mmol/L (ref 101–111)
GFR calc Af Amer: 50 mL/min — ABNORMAL LOW (ref >60)
GFR calc non Af Amer: 43 mL/min — ABNORMAL LOW (ref >60)
Glucose, Bld: 97 mg/dL (ref 65–99)
POTASSIUM: 3.8 mmol/L (ref 3.5–5.1)
Sodium: 138 mmol/L (ref 135–145)
TOTAL PROTEIN: 6.7 g/dL (ref 6.5–8.1)

## 2016-02-20 LAB — SURGICAL PCR SCREEN
MRSA, PCR: NEGATIVE
Staphylococcus aureus: NEGATIVE

## 2016-02-20 NOTE — Progress Notes (Signed)
PCP: Dr. Dorothea Glassman in [place]--requested notes Pulm: Dr. Donor in [place]--requested notes   No cardiologist at present. Saw Dr. Tempie Hoist 4-5 ys. Ago-stated she didn't have to follow up with him anymore

## 2016-02-23 NOTE — Progress Notes (Signed)
Anesthesia chart review: Patient is a 76 year old female scheduled for right TKA on 03/01/16 by Dr. Ronnie Derby.  History includes never smoker, asthma, restrictive lung disease, nocturnal supplemental oxygen, hypertension, hypothyroidism, goiter, GERD, hiatal hernia, murmur, exertional dyspnea, skin cancer, arthritis, neuropathy, cholecystectomy, appendectomy, hysterectomy, nasal surgery, left TKA '13, L4-5 decompression '06.  - PCP is Dr. Nelda Bucks, last visit 02/12/16. He is aware of TKA plans..  - Pulmonologist is Dr. Welford Roche, records pending. (There are some pulmonology records in [place] from 09/02/15 by Dr. Karie Chimera with [facility].) - She is not routinely followed by cardiologist, but was seen by Dr. Glori Bickers, last on 01/08/11. 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 He did not think there was an indication for pacemaker that point. Diastolic dysfunction was felt mild. He felt she could have when necessary cardiology follow-up  Meds include aspirin 81 mg, Lipitor, Qvar, fish oil, Lasix, levothyroxine, lisinopril, Claritin-D, Myrbetriq, Prilosec, Paxil, KCl, Inderal, thiamine.  BP (!) 134/49   Pulse (!) 57   Temp 36.3 C   Resp 20   Ht 5' 6.5" (1.689 m)   Wt 194 lb 6.4 oz (88.2 kg)   SpO2 95%   BMI 30.91 kg/m   EKG 02/20/16: Unusual P axis, possible ectopic atrial bradycardia (rate 54 bpm), minimal voltage criteria for LVH, may be normal variant. I think tracing appears similar to 01/08/11 and 08/29/09 tracings--and also similar to tracing from her PCP office.  Echo 09/29/11: Study Conclusions - Left ventricle: The cavity size was normal. Wall thickness was normal. Systolic function was normal. The estimated ejection fraction was in the range of 55% to 60%. Wall motion was normal; there were no regional wall motion abnormalities. Features are consistent with a pseudonormal left ventricular  filling pattern, with concomitant abnormal relaxation and increased filling pressure (grade 2 diastolic dysfunction). - Pulmonary arteries: PA peak pressure: 62mm Hg (S).  CPX 09/22/09: pVO2 13.9 (77%) with slope 42 RER 1.13  HR response blunted with peak HR 86 Ve/MVV 62%. No desats with exercise. Resting spiro FEV1 1.5 (58%) FVC 1.9 (57%) FEV1/FVC 78%. Felt to be mild to moderate defect due to restrictive lung disease, chrontropic incompetence and possibly diastolic dysfunction. Propanolol cut in half (needs it for tremors).  By 01/08/11 cardiology note: FVC 55% FEV1 1.47 (60%) FEV1/FVC 110%  DLCO 55%. No comment on RV. RVSP est 39. Diagnosed with asthma.   Preoperative labs noted. Creatinine 1.20 (comparison Cr 1.05 on 01/27/15, Care Everywhere and 1.03 by 02/12/16 PCP note). H&H 11.6 and 36.2. Glucose 97.  Reviewed above with anesthesiologist Dr. Marcie Bal. Agrees that EKG appears similar to those done in 2011-2012. She had mild bradycardia then as well--no indication for PPM at that time. If no acute changes then it is anticipated that she can proceed as planned.  [facility] Short Stay Center/Anesthesiology Phone [phone] 02/23/2016 2:46 PM

## 2016-02-25 DIAGNOSIS — J301 Allergic rhinitis due to pollen: Secondary | ICD-10-CM | POA: Diagnosis not present

## 2016-02-25 DIAGNOSIS — J4530 Mild persistent asthma, uncomplicated: Secondary | ICD-10-CM | POA: Diagnosis not present

## 2016-02-27 MED ORDER — BUPIVACAINE LIPOSOME 1.3 % IJ SUSP
20.0000 mL | Freq: Once | INTRAMUSCULAR | Status: AC
  Administered 2016-03-01: 20 mL
  Filled 2016-02-27: qty 20

## 2016-02-27 MED ORDER — TRANEXAMIC ACID 1000 MG/10ML IV SOLN
1000.0000 mg | INTRAVENOUS | Status: AC
  Administered 2016-03-01: 1000 mg via INTRAVENOUS
  Filled 2016-02-27: qty 10

## 2016-02-29 NOTE — Anesthesia Preprocedure Evaluation (Addendum)
Anesthesia Evaluation  Patient identified by MRN, date of birth, ID band Patient awake    Reviewed: Allergy & Precautions, NPO status , Patient's Chart, lab work & pertinent test results  Airway Mallampati: II  TM Distance: >3 FB Neck ROM: Full    Dental  (+) Dental Advisory Given   Pulmonary asthma ,    breath sounds clear to auscultation       Cardiovascular hypertension, Pt. on medications and Pt. on home beta blockers  Rhythm:Regular Rate:Normal     Neuro/Psych  Neuromuscular disease    GI/Hepatic Neg liver ROS, hiatal hernia, GERD  ,  Endo/Other  Hypothyroidism   Renal/GU negative Renal ROS     Musculoskeletal  (+) Arthritis ,   Abdominal   Peds  Hematology  (+) anemia ,   Anesthesia Other Findings   Reproductive/Obstetrics                            Lab Results  Component Value Date   WBC 10.2 02/20/2016   HGB 11.6 (L) 02/20/2016   HCT 36.2 02/20/2016   MCV 91.2 02/20/2016   PLT 225 02/20/2016   Lab Results  Component Value Date   CREATININE 1.20 (H) 02/20/2016   BUN 15 02/20/2016   NA 138 02/20/2016   K 3.8 02/20/2016   CL 102 02/20/2016   CO2 27 02/20/2016     Anesthesia Physical Anesthesia Plan  ASA: III  Anesthesia Plan: Spinal   Post-op Pain Management:  Regional for Post-op pain   Induction: Intravenous  Airway Management Planned: Natural Airway and Simple Face Mask  Additional Equipment:   Intra-op Plan:   Post-operative Plan:   Informed Consent: I have reviewed the patients History and Physical, chart, labs and discussed the procedure including the risks, benefits and alternatives for the proposed anesthesia with the patient or authorized representative who has indicated his/her understanding and acceptance.   Dental advisory given  Plan Discussed with: CRNA  Anesthesia Plan Comments:        Anesthesia Quick Evaluation

## 2016-02-29 NOTE — H&P (Signed)
Rachel Green MRN:  [id number] DOB/SEX:  May 08, 1940/female  CHIEF COMPLAINT:  Painful right Knee  HISTORY: Patient is a 76 y.o. female presented with a history of pain in the right knee. Onset of symptoms was gradual starting a few years ago with gradually worsening course since that time. Patient has been treated conservatively with over-the-counter NSAIDs and activity modification. Patient currently rates pain in the knee at 10 out of 10 with activity. There is pain at night.  PAST MEDICAL HISTORY: Patient Active Problem List   Diagnosis Date Noted  . Chronic pain of right knee 06/17/2015  . Mild persistent asthma without complication [id number]  . Seasonal allergic rhinitis due to pollen 06/02/2015  . Syncope and collapse 02/24/2015  . Allergic rhinitis 09/07/2013  . Allergic rhinitis due to pollen 09/07/2013  . Asthma 09/07/2013  . Candidiasis of mouth 09/07/2013  . Capsulitis of foot, left 09/07/2013  . Capsulitis of foot, right 09/07/2013  . Cough 09/07/2013  . Cystocele 09/07/2013  . 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 Diastolic dysfunction [id number]  . Edema 09/07/2013  . Effusion of foot 09/07/2013  . Enthesopathy of ankle and tarsus 09/07/2013  . Esophageal reflux disease 09/07/2013  . Headache 09/07/2013  . Hoarseness 09/07/2013  . Hypertension 09/07/2013  . Hyperthyroidism 09/07/2013  . Hypoxemia 09/07/2013  . Incomplete emptying of bladder 09/07/2013  . Metatarsalgia of left foot 09/07/2013  . Metatarsalgia of right foot 09/07/2013  . Neuralgia and neuritis 09/07/2013  . Other symptoms involving urinary system(788.99) 09/07/2013  . Peripheral neuropathy ([place]) 09/07/2013  . Plantar fasciitis 09/07/2013  . Postnasal drip 09/07/2013  . Sensorineural hearing loss (SNHL) of both ears 09/07/2013  . Simple goiter 09/07/2013  . Symptomatic menopausal or female climacteric states 09/07/2013  . Tinnitus of both ears 09/07/2013  . Urge incontinence of urine 09/07/2013   . Increased frequency of urination 09/07/2013  . Urinary urgency 09/07/2013  . Vaginitis 09/07/2013  . Vulvar itching 09/07/2013  . Essential tremor 12/19/2012  . 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 Acute blood loss anemia 10/17/2011  . Dysphagia 05/18/2011  . Dysphonia 05/18/2011  . Bradycardia 01/08/2011  . Shortness of breath 08/29/2009   Past Medical History:  Diagnosis Date  . Arthritis   . Asthma   . Cancer [facility])    Skin cancer- face- right  . Ecchymosis   . GERD (gastroesophageal reflux disease)   . Heart murmur    "years ago" not now  . Hemorrhoid   . Hiatal hernia   . HTN (hypertension)   . Hypothyroidism   . Multiple allergies    grass, Pecan trees  . Neuropathy ([place])   . 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 Restrictive lung disease   . Shortness of breath    with activity  . Sinus headache   . Thyroid disease    with goiter   Past Surgical History:  Procedure Laterality Date  . ABDOMINAL HYSTERECTOMY     Partial then complete  . APPENDECTOMY    . CHOLECYSTECTOMY    . Dyspnea    . EYE SURGERY     cataract bil  . Hypertension    . KNEE ARTHROSCOPY  10/2010  . NOSE SURGERY     bobe straighten  . TOTAL KNEE ARTHROPLASTY  10/15/2011   Procedure: TOTAL KNEE ARTHROPLASTY;  Surgeon: Yvette Rack., MD;  Location: [facility];  Service: Orthopedics;  Laterality: Left;     MEDICATIONS:   No prescriptions prior to admission.  ALLERGIES:   Allergies  Allergen Reactions  . Oxybutynin Chloride Anaphylaxis and Other (See Comments)    Throat Swelling Throat Swelling Per documents sent over - patient denies  . Azithromycin Rash and Hives  . Emetrol Rash  . Penicillins Diarrhea and Nausea And Vomiting    Has patient had a PCN reaction causing immediate rash, facial/tongue/throat swelling, SOB or lightheadedness with hypotension: No Has patient had a PCN reaction causing severe rash involving mucus membranes or skin necrosis: No Has patient had  a PCN reaction that required hospitalization No Has patient had a PCN reaction occurring within the last 10 years: No If all of the above answers are "NO", then may proceed with Cephalosporin use.   Marland Kitchen Petrolatum-Zinc Oxide Rash    Throat Swelling Throat Swelling Throat Swelling Throat Swelling Throat Swelling  . Sulfa Antibiotics Rash  . Ampicillin Other (See Comments)    Hands tingle and hurt  . Levofloxacin Nausea Only    REVIEW OF SYSTEMS:  A comprehensive review of systems was negative except for: Musculoskeletal: positive for arthralgias and bone pain   FAMILY HISTORY:   Family History  Problem Relation Age of Onset  . Cancer    . Diabetes      SOCIAL HISTORY:   Social History  Substance Use Topics  . Smoking status: Never Smoker  . Smokeless tobacco: Never Used  . Alcohol use No     EXAMINATION:  Vital signs in last 24 hours:    There were no vitals taken for this visit.  General Appearance:    Alert, cooperative, no distress, appears stated age  Head:    Normocephalic, without obvious abnormality, atraumatic  Eyes:    PERRL, conjunctiva/corneas clear, EOM's intact, fundi    benign, both eyes  Ears:    Normal TM's and external ear canals, both ears  Nose:   Nares normal, septum midline, mucosa normal, no drainage    or sinus tenderness  Throat:   Lips, mucosa, and tongue normal; teeth and gums normal  Neck:   Supple, symmetrical, trachea midline, no adenopathy;    thyroid:  no enlargement/tenderness/nodules; no carotid   bruit or JVD  Back:     Symmetric, no curvature, ROM normal, no CVA tenderness  Lungs:     Clear to auscultation bilaterally, respirations unlabored  Chest Wall:    No tenderness or deformity   Heart:    Regular rate and rhythm, S1 and S2 normal, no murmur, rub   or gallop  Breast Exam:    No tenderness, masses, or nipple abnormality  Abdomen:     Soft, non-tender, bowel sounds active all four quadrants,    no masses, no organomegaly   Genitalia:    Normal female without lesion, discharge or tenderness  Rectal:    Normal tone, no masses or tenderness;   guaiac negative stool  Extremities:   Extremities normal, atraumatic, no cyanosis or edema  Pulses:   2+ and symmetric all extremities  Skin:   Skin color, texture, turgor normal, no rashes or lesions  Lymph nodes:   Cervical, supraclavicular, and axillary nodes normal  Neurologic:   CNII-XII intact, normal strength, sensation and reflexes    throughout    Musculoskeletal:  ROM 0-110, Ligaments intact,  Imaging Review Plain radiographs demonstrate severe degenerative joint disease of the right knee. The overall alignment is neutral. The bone quality appears to be good for age and reported activity level.  Assessment/Plan: Primary osteoarthritis, right knee   The  patient history, physical examination and imaging studies are consistent with advanced degenerative joint disease of the right knee. The patient has failed conservative treatment.  The clearance notes were reviewed.  After discussion with the patient it was felt that Total Knee Replacement was indicated. The procedure,  risks, and benefits of total knee arthroplasty were presented and reviewed. The risks including but not limited to aseptic loosening, infection, blood clots, vascular injury, stiffness, patella tracking problems complications among others were discussed. The patient acknowledged the explanation, agreed to proceed with the plan.  Donia Ast 02/29/2016, 9:22 PM

## 2016-03-01 ENCOUNTER — Encounter (HOSPITAL_COMMUNITY)
Admission: RE | Disposition: A | Discharge status: Home / Self Care | Payer: Self-pay | Source: Ambulatory Visit | Attending: Orthopedic Surgery

## 2016-03-01 ENCOUNTER — Encounter (HOSPITAL_COMMUNITY): Payer: Self-pay | Admitting: *Deleted

## 2016-03-01 ENCOUNTER — Inpatient Hospital Stay (HOSPITAL_COMMUNITY): Payer: Medicare Other | Admitting: Vascular Surgery

## 2016-03-01 ENCOUNTER — Inpatient Hospital Stay (HOSPITAL_COMMUNITY): Payer: Medicare Other | Admitting: Anesthesiology

## 2016-03-01 ENCOUNTER — Observation Stay (HOSPITAL_COMMUNITY)
Admission: RE | Admit: 2016-03-01 | Discharge: 2016-03-02 | Disposition: A | Discharge status: Home / Self Care | Payer: Medicare Other | Source: Ambulatory Visit | Attending: Orthopedic Surgery | Admitting: Orthopedic Surgery

## 2016-03-01 DIAGNOSIS — H903 Sensorineural hearing loss, bilateral: Secondary | ICD-10-CM | POA: Diagnosis not present

## 2016-03-01 DIAGNOSIS — Z880 Allergy status to penicillin: Secondary | ICD-10-CM | POA: Diagnosis not present

## 2016-03-01 DIAGNOSIS — M1711 Unilateral primary osteoarthritis, right knee: Principal | ICD-10-CM | POA: Insufficient documentation

## 2016-03-01 DIAGNOSIS — Z882 Allergy status to sulfonamides status: Secondary | ICD-10-CM | POA: Insufficient documentation

## 2016-03-01 DIAGNOSIS — R1310 Dysphagia, unspecified: Secondary | ICD-10-CM | POA: Diagnosis not present

## 2016-03-01 DIAGNOSIS — J45909 Unspecified asthma, uncomplicated: Secondary | ICD-10-CM | POA: Diagnosis not present

## 2016-03-01 DIAGNOSIS — K219 Gastro-esophageal reflux disease without esophagitis: Secondary | ICD-10-CM | POA: Diagnosis not present

## 2016-03-01 DIAGNOSIS — I10 Essential (primary) hypertension: Secondary | ICD-10-CM | POA: Diagnosis not present

## 2016-03-01 DIAGNOSIS — E039 Hypothyroidism, unspecified: Secondary | ICD-10-CM | POA: Diagnosis not present

## 2016-03-01 DIAGNOSIS — Z881 Allergy status to other antibiotic agents status: Secondary | ICD-10-CM | POA: Diagnosis not present

## 2016-03-01 DIAGNOSIS — Z888 Allergy status to other drugs, medicaments and biological substances status: Secondary | ICD-10-CM | POA: Insufficient documentation

## 2016-03-01 DIAGNOSIS — Z85828 Personal history of other malignant neoplasm of skin: Secondary | ICD-10-CM | POA: Insufficient documentation

## 2016-03-01 DIAGNOSIS — Z96659 Presence of unspecified artificial knee joint: Secondary | ICD-10-CM

## 2016-03-01 DIAGNOSIS — G8918 Other acute postprocedural pain: Secondary | ICD-10-CM | POA: Diagnosis not present

## 2016-03-01 DIAGNOSIS — Z7982 Long term (current) use of aspirin: Secondary | ICD-10-CM | POA: Diagnosis not present

## 2016-03-01 DIAGNOSIS — Z885 Allergy status to narcotic agent status: Secondary | ICD-10-CM | POA: Diagnosis not present

## 2016-03-01 DIAGNOSIS — G603 Idiopathic progressive neuropathy: Secondary | ICD-10-CM | POA: Diagnosis not present

## 2016-03-01 DIAGNOSIS — Z9981 Dependence on supplemental oxygen: Secondary | ICD-10-CM | POA: Insufficient documentation

## 2016-03-01 DIAGNOSIS — Z96651 Presence of right artificial knee joint: Secondary | ICD-10-CM

## 2016-03-01 HISTORY — PX: TOTAL KNEE ARTHROPLASTY: SHX125

## 2016-03-01 SURGERY — ARTHROPLASTY, KNEE, TOTAL
Anesthesia: Spinal | Site: Knee | Laterality: Right

## 2016-03-01 MED ORDER — LACTATED RINGERS IV SOLN
INTRAVENOUS | Status: DC | PRN
  Administered 2016-03-01 (×2): via INTRAVENOUS

## 2016-03-01 MED ORDER — DEXTROSE 5 % IV SOLN
INTRAVENOUS | Status: DC | PRN
  Administered 2016-03-01: 10 ug/min via INTRAVENOUS

## 2016-03-01 MED ORDER — ONDANSETRON HCL 4 MG PO TABS: 4.0000 mg | ORAL_TABLET | Freq: Four times a day (QID) | ORAL | Status: DC | PRN

## 2016-03-01 MED ORDER — ONDANSETRON HCL 4 MG/2ML IJ SOLN: 4.0000 mg | Freq: Four times a day (QID) | INTRAMUSCULAR | Status: DC | PRN

## 2016-03-01 MED ORDER — MIRABEGRON ER 25 MG PO TB24
50.0000 mg | ORAL_TABLET | Freq: Every day | ORAL | Status: DC
  Administered 2016-03-02: 50 mg via ORAL
  Filled 2016-03-01 (×2): qty 2

## 2016-03-01 MED ORDER — ACETAMINOPHEN 650 MG RE SUPP: 650.0000 mg | Freq: Four times a day (QID) | RECTAL | Status: DC | PRN

## 2016-03-01 MED ORDER — FENTANYL CITRATE (PF) 100 MCG/2ML IJ SOLN
INTRAMUSCULAR | Status: DC | PRN
  Administered 2016-03-01: 50 ug via INTRAVENOUS

## 2016-03-01 MED ORDER — DEXAMETHASONE SODIUM PHOSPHATE 10 MG/ML IJ SOLN
10.0000 mg | Freq: Once | INTRAMUSCULAR | Status: AC
  Administered 2016-03-02: 10 mg via INTRAVENOUS
  Filled 2016-03-01: qty 1

## 2016-03-01 MED ORDER — DEXTROSE 5 % IV SOLN: 500.0000 mg | Freq: Four times a day (QID) | INTRAVENOUS | Status: DC | PRN

## 2016-03-01 MED ORDER — DIPHENHYDRAMINE HCL 12.5 MG/5ML PO ELIX: 12.5000 mg | ORAL_SOLUTION | ORAL | Status: DC | PRN

## 2016-03-01 MED ORDER — HYDROMORPHONE HCL 2 MG/ML IJ SOLN: 1.0000 mg | INTRAMUSCULAR | Status: DC | PRN

## 2016-03-01 MED ORDER — PROPOFOL 500 MG/50ML IV EMUL
INTRAVENOUS | Status: DC | PRN
Start: 2016-03-01 — End: 2016-03-01
  Administered 2016-03-01: 50 ug/kg/min via INTRAVENOUS

## 2016-03-01 MED ORDER — HYDRALAZINE HCL 20 MG/ML IJ SOLN
INTRAMUSCULAR | Status: AC
  Filled 2016-03-01: qty 1

## 2016-03-01 MED ORDER — OXYCODONE HCL 5 MG PO TABS
5.0000 mg | ORAL_TABLET | ORAL | Status: DC | PRN
Start: 2016-03-01 — End: 2016-03-02

## 2016-03-01 MED ORDER — ACETAMINOPHEN 500 MG PO TABS
ORAL_TABLET | ORAL | Status: AC
  Filled 2016-03-01: qty 2

## 2016-03-01 MED ORDER — SODIUM CHLORIDE 0.9 % IR SOLN
Status: DC | PRN
  Administered 2016-03-01: 3000 mL

## 2016-03-01 MED ORDER — PROPRANOLOL HCL 20 MG PO TABS
20.0000 mg | ORAL_TABLET | Freq: Every day | ORAL | Status: DC
  Administered 2016-03-02: 20 mg via ORAL
  Filled 2016-03-01: qty 1

## 2016-03-01 MED ORDER — GABAPENTIN 300 MG PO CAPS
300.0000 mg | ORAL_CAPSULE | Freq: Three times a day (TID) | ORAL | Status: DC
  Administered 2016-03-01 – 2016-03-02 (×4): 300 mg via ORAL
  Filled 2016-03-01 (×4): qty 1

## 2016-03-01 MED ORDER — ACETAMINOPHEN 325 MG PO TABS
650.0000 mg | ORAL_TABLET | Freq: Four times a day (QID) | ORAL | Status: DC | PRN
  Administered 2016-03-01 – 2016-03-02 (×2): 650 mg via ORAL
  Filled 2016-03-01: qty 2

## 2016-03-01 MED ORDER — SODIUM CHLORIDE 0.9 % IJ SOLN
INTRAMUSCULAR | Status: DC | PRN
  Administered 2016-03-01: 20 mL

## 2016-03-01 MED ORDER — BUDESONIDE 0.25 MG/2ML IN SUSP
0.2500 mg | Freq: Two times a day (BID) | RESPIRATORY_TRACT | Status: DC
  Administered 2016-03-01: 0.25 mg via RESPIRATORY_TRACT
  Filled 2016-03-01 (×2): qty 2

## 2016-03-01 MED ORDER — MIDAZOLAM HCL 2 MG/2ML IJ SOLN
INTRAMUSCULAR | Status: AC
  Filled 2016-03-01: qty 2

## 2016-03-01 MED ORDER — FENTANYL CITRATE (PF) 100 MCG/2ML IJ SOLN
INTRAMUSCULAR | Status: AC
  Filled 2016-03-01: qty 2

## 2016-03-01 MED ORDER — METHOCARBAMOL 500 MG PO TABS: 500.0000 mg | ORAL_TABLET | Freq: Four times a day (QID) | ORAL | Status: DC | PRN

## 2016-03-01 MED ORDER — MIDAZOLAM HCL 5 MG/5ML IJ SOLN
INTRAMUSCULAR | Status: DC | PRN
Start: 2016-03-01 — End: 2016-03-01
  Administered 2016-03-01: 1 mg via INTRAVENOUS

## 2016-03-01 MED ORDER — CHLORHEXIDINE GLUCONATE 4 % EX LIQD: 60.0000 mL | Freq: Once | CUTANEOUS | Status: DC

## 2016-03-01 MED ORDER — LISINOPRIL 40 MG PO TABS
40.0000 mg | ORAL_TABLET | Freq: Every day | ORAL | Status: DC
  Administered 2016-03-02: 40 mg via ORAL
  Filled 2016-03-01: qty 1

## 2016-03-01 MED ORDER — EPINEPHRINE PF 1 MG/ML IJ SOLN
INTRAMUSCULAR | Status: AC
  Filled 2016-03-01: qty 1

## 2016-03-01 MED ORDER — HYDRALAZINE HCL 20 MG/ML IJ SOLN
5.0000 mg | Freq: Once | INTRAMUSCULAR | Status: AC
  Administered 2016-03-01: 5 mg via INTRAVENOUS

## 2016-03-01 MED ORDER — FUROSEMIDE 40 MG PO TABS
40.0000 mg | ORAL_TABLET | Freq: Every day | ORAL | Status: DC
  Administered 2016-03-02: 40 mg via ORAL
  Filled 2016-03-01: qty 1

## 2016-03-01 MED ORDER — PROPOFOL 10 MG/ML IV BOLUS
INTRAVENOUS | Status: AC
  Filled 2016-03-01: qty 20

## 2016-03-01 MED ORDER — HYDROCODONE-ACETAMINOPHEN 7.5-325 MG PO TABS
1.0000 | ORAL_TABLET | Freq: Four times a day (QID) | ORAL | Status: DC
  Administered 2016-03-01 – 2016-03-02 (×3): 1 via ORAL
  Filled 2016-03-01 (×3): qty 1

## 2016-03-01 MED ORDER — BUPIVACAINE-EPINEPHRINE 0.5% -1:200000 IJ SOLN
INTRAMUSCULAR | Status: DC | PRN
  Administered 2016-03-01: 30 mL

## 2016-03-01 MED ORDER — CELECOXIB 200 MG PO CAPS
200.0000 mg | ORAL_CAPSULE | Freq: Two times a day (BID) | ORAL | Status: DC
  Administered 2016-03-01 – 2016-03-02 (×3): 200 mg via ORAL
  Filled 2016-03-01 (×3): qty 1

## 2016-03-01 MED ORDER — METOCLOPRAMIDE HCL 5 MG PO TABS: 5.0000 mg | ORAL_TABLET | Freq: Three times a day (TID) | ORAL | Status: DC | PRN

## 2016-03-01 MED ORDER — PHENOL 1.4 % MT LIQD
1.0000 | OROMUCOSAL | Status: DC | PRN
Start: 2016-03-01 — End: 2016-03-02

## 2016-03-01 MED ORDER — PANTOPRAZOLE SODIUM 40 MG PO TBEC
40.0000 mg | DELAYED_RELEASE_TABLET | Freq: Every day | ORAL | Status: DC
  Administered 2016-03-02: 40 mg via ORAL
  Filled 2016-03-01: qty 1

## 2016-03-01 MED ORDER — LEVOTHYROXINE SODIUM 25 MCG PO TABS
25.0000 ug | ORAL_TABLET | Freq: Every day | ORAL | Status: DC
  Administered 2016-03-02: 25 ug via ORAL
  Filled 2016-03-01: qty 1

## 2016-03-01 MED ORDER — TRANEXAMIC ACID 1000 MG/10ML IV SOLN
1000.0000 mg | Freq: Once | INTRAVENOUS | Status: AC
  Administered 2016-03-01: 1000 mg via INTRAVENOUS
  Filled 2016-03-01 (×2): qty 10

## 2016-03-01 MED ORDER — ASPIRIN EC 325 MG PO TBEC
325.0000 mg | DELAYED_RELEASE_TABLET | Freq: Two times a day (BID) | ORAL | Status: DC
  Administered 2016-03-02: 325 mg via ORAL
  Filled 2016-03-01: qty 1

## 2016-03-01 MED ORDER — ZOLPIDEM TARTRATE 5 MG PO TABS: 5.0000 mg | ORAL_TABLET | Freq: Every evening | ORAL | Status: DC | PRN

## 2016-03-01 MED ORDER — MENTHOL 3 MG MT LOZG: 1.0000 | LOZENGE | OROMUCOSAL | Status: DC | PRN

## 2016-03-01 MED ORDER — ACETAMINOPHEN 500 MG PO TABS
1000.0000 mg | ORAL_TABLET | Freq: Once | ORAL | Status: AC
  Administered 2016-03-01: 1000 mg via ORAL
  Filled 2016-03-01: qty 2

## 2016-03-01 MED ORDER — SENNOSIDES-DOCUSATE SODIUM 8.6-50 MG PO TABS: 1.0000 | ORAL_TABLET | Freq: Every evening | ORAL | Status: DC | PRN

## 2016-03-01 MED ORDER — VANCOMYCIN HCL IN DEXTROSE 1-5 GM/200ML-% IV SOLN
1000.0000 mg | INTRAVENOUS | Status: DC
  Filled 2016-03-01: qty 200

## 2016-03-01 MED ORDER — BISACODYL 5 MG PO TBEC: 5.0000 mg | DELAYED_RELEASE_TABLET | Freq: Every day | ORAL | Status: DC | PRN

## 2016-03-01 MED ORDER — DOCUSATE SODIUM 100 MG PO CAPS
100.0000 mg | ORAL_CAPSULE | Freq: Two times a day (BID) | ORAL | Status: DC
  Administered 2016-03-01 – 2016-03-02 (×2): 100 mg via ORAL
  Filled 2016-03-01 (×2): qty 1

## 2016-03-01 MED ORDER — ROPIVACAINE HCL 7.5 MG/ML IJ SOLN
INTRAMUSCULAR | Status: DC | PRN
  Administered 2016-03-01: 20 mL via PERINEURAL

## 2016-03-01 MED ORDER — PROMETHAZINE HCL 25 MG/ML IJ SOLN: 6.2500 mg | INTRAMUSCULAR | Status: DC | PRN

## 2016-03-01 MED ORDER — ATORVASTATIN CALCIUM 40 MG PO TABS
40.0000 mg | ORAL_TABLET | Freq: Every day | ORAL | Status: DC
  Administered 2016-03-01: 40 mg via ORAL
  Filled 2016-03-01: qty 1

## 2016-03-01 MED ORDER — POTASSIUM CHLORIDE CRYS ER 20 MEQ PO TBCR
20.0000 meq | EXTENDED_RELEASE_TABLET | Freq: Every day | ORAL | Status: DC
  Administered 2016-03-02: 20 meq via ORAL
  Filled 2016-03-01: qty 1

## 2016-03-01 MED ORDER — HYDROMORPHONE HCL 1 MG/ML IJ SOLN: 0.2500 mg | INTRAMUSCULAR | Status: DC | PRN

## 2016-03-01 MED ORDER — BUPIVACAINE HCL (PF) 0.25 % IJ SOLN
INTRAMUSCULAR | Status: AC
  Filled 2016-03-01: qty 30

## 2016-03-01 MED ORDER — GABAPENTIN 300 MG PO CAPS
300.0000 mg | ORAL_CAPSULE | Freq: Once | ORAL | Status: AC
  Administered 2016-03-01: 300 mg via ORAL
  Filled 2016-03-01: qty 1

## 2016-03-01 MED ORDER — BUPIVACAINE-EPINEPHRINE (PF) 0.5% -1:200000 IJ SOLN
INTRAMUSCULAR | Status: AC
  Filled 2016-03-01: qty 30

## 2016-03-01 MED ORDER — CEFAZOLIN IN D5W 1 GM/50ML IV SOLN
1.0000 g | Freq: Four times a day (QID) | INTRAVENOUS | Status: AC
  Administered 2016-03-01 (×2): 1 g via INTRAVENOUS
  Filled 2016-03-01 (×3): qty 50

## 2016-03-01 MED ORDER — METOCLOPRAMIDE HCL 5 MG/ML IJ SOLN: 5.0000 mg | Freq: Three times a day (TID) | INTRAMUSCULAR | Status: DC | PRN

## 2016-03-01 MED ORDER — PAROXETINE HCL 20 MG PO TABS
20.0000 mg | ORAL_TABLET | Freq: Every day | ORAL | Status: DC
  Administered 2016-03-02: 20 mg via ORAL
  Filled 2016-03-01: qty 1

## 2016-03-01 MED ORDER — ALUM & MAG HYDROXIDE-SIMETH 200-200-20 MG/5ML PO SUSP: 30.0000 mL | ORAL | Status: DC | PRN

## 2016-03-01 MED ORDER — DEXAMETHASONE SODIUM PHOSPHATE 10 MG/ML IJ SOLN
8.0000 mg | Freq: Once | INTRAMUSCULAR | Status: DC
  Filled 2016-03-01: qty 1

## 2016-03-01 MED ORDER — SODIUM CHLORIDE 0.9 % IV SOLN
INTRAVENOUS | Status: DC
  Administered 2016-03-01: 14:00:00 via INTRAVENOUS

## 2016-03-01 MED ORDER — FLEET ENEMA 7-19 GM/118ML RE ENEM: 1.0000 | ENEMA | Freq: Once | RECTAL | Status: DC | PRN

## 2016-03-01 MED ORDER — CEFAZOLIN SODIUM-DEXTROSE 2-3 GM-% IV SOLR
INTRAVENOUS | Status: DC | PRN
  Administered 2016-03-01: 2 g via INTRAVENOUS

## 2016-03-01 SURGICAL SUPPLY — 61 items
BANDAGE ACE 6X5 VEL STRL LF (GAUZE/BANDAGES/DRESSINGS) IMPLANT
BANDAGE ESMARK 6X9 LF (GAUZE/BANDAGES/DRESSINGS) ×1 IMPLANT
BLADE SAGITTAL 13X1.27X60 (BLADE) ×2 IMPLANT
BLADE SAGITTAL 13X1.27X60MM (BLADE) ×1
BLADE SAW SGTL 83.5X18.5 (BLADE) ×3 IMPLANT
BLADE SURG 10 STRL SS (BLADE) ×3 IMPLANT
BNDG ELASTIC 6X15 VLCR STRL LF (GAUZE/BANDAGES/DRESSINGS) ×3 IMPLANT
BNDG ESMARK 6X9 LF (GAUZE/BANDAGES/DRESSINGS) ×3
BOWL SMART MIX CTS (DISPOSABLE) ×3 IMPLANT
CAPT KNEE TOTAL 3 ×3 IMPLANT
CEMENT BONE SIMPLEX SPEEDSET (Cement) ×6 IMPLANT
CLOSURE WOUND 1/2 X4 (GAUZE/BANDAGES/DRESSINGS) ×1
COVER SURGICAL LIGHT HANDLE (MISCELLANEOUS) ×3 IMPLANT
CUFF TOURNIQUET SINGLE 34IN LL (TOURNIQUET CUFF) ×3 IMPLANT
DRAPE EXTREMITY T 121X128X90 (DRAPE) ×3 IMPLANT
DRAPE HALF SHEET 40X57 (DRAPES) ×3 IMPLANT
DRAPE INCISE IOBAN 66X45 STRL (DRAPES) ×6 IMPLANT
DRAPE U-SHAPE 47X51 STRL (DRAPES) ×3 IMPLANT
DRSG AQUACEL AG ADV 3.5X10 (GAUZE/BANDAGES/DRESSINGS) ×3 IMPLANT
DURAPREP 26ML APPLICATOR (WOUND CARE) ×6 IMPLANT
ELECT REM PT RETURN 9FT ADLT (ELECTROSURGICAL) ×3
ELECTRODE REM PT RTRN 9FT ADLT (ELECTROSURGICAL) ×1 IMPLANT
FILTER STRAW FLUID ASPIR (MISCELLANEOUS) IMPLANT
GLOVE BIOGEL M 7.0 STRL (GLOVE) IMPLANT
GLOVE BIOGEL PI IND STRL 7.5 (GLOVE) IMPLANT
GLOVE BIOGEL PI IND STRL 8.5 (GLOVE) ×1 IMPLANT
GLOVE BIOGEL PI INDICATOR 7.5 (GLOVE)
GLOVE BIOGEL PI INDICATOR 8.5 (GLOVE) ×2
GLOVE SURG ORTHO 8.0 STRL STRW (GLOVE) ×6 IMPLANT
GOWN STRL REUS W/ TWL LRG LVL3 (GOWN DISPOSABLE) ×1 IMPLANT
GOWN STRL REUS W/ TWL XL LVL3 (GOWN DISPOSABLE) ×2 IMPLANT
GOWN STRL REUS W/TWL 2XL LVL3 (GOWN DISPOSABLE) ×3 IMPLANT
GOWN STRL REUS W/TWL LRG LVL3 (GOWN DISPOSABLE) ×2
GOWN STRL REUS W/TWL XL LVL3 (GOWN DISPOSABLE) ×4
HANDPIECE INTERPULSE COAX TIP (DISPOSABLE) ×2
HOOD PEEL AWAY FACE SHEILD DIS (HOOD) ×9 IMPLANT
KIT BASIN OR (CUSTOM PROCEDURE TRAY) ×3 IMPLANT
KIT ROOM TURNOVER OR (KITS) ×3 IMPLANT
KNEE CAPITATED TOTAL 3 ×1 IMPLANT
MANIFOLD NEPTUNE II (INSTRUMENTS) ×3 IMPLANT
NEEDLE 18GX1X1/2 (RX/OR ONLY) (NEEDLE) IMPLANT
NEEDLE 22X1 1/2 (OR ONLY) (NEEDLE) ×6 IMPLANT
NS IRRIG 1000ML POUR BTL (IV SOLUTION) ×3 IMPLANT
PACK TOTAL JOINT (CUSTOM PROCEDURE TRAY) ×3 IMPLANT
PAD ARMBOARD 7.5X6 YLW CONV (MISCELLANEOUS) ×6 IMPLANT
SET HNDPC FAN SPRY TIP SCT (DISPOSABLE) ×1 IMPLANT
STRIP CLOSURE SKIN 1/2X4 (GAUZE/BANDAGES/DRESSINGS) ×2 IMPLANT
SUCTION FRAZIER HANDLE 10FR (MISCELLANEOUS)
SUCTION TUBE FRAZIER 10FR DISP (MISCELLANEOUS) IMPLANT
SUT MNCRL AB 3-0 PS2 18 (SUTURE) ×3 IMPLANT
SUT VIC AB 0 CTB1 27 (SUTURE) ×6 IMPLANT
SUT VIC AB 1 CT1 27 (SUTURE) ×4
SUT VIC AB 1 CT1 27XBRD ANBCTR (SUTURE) ×2 IMPLANT
SUT VIC AB 2-0 CT1 27 (SUTURE) ×4
SUT VIC AB 2-0 CT1 TAPERPNT 27 (SUTURE) ×2 IMPLANT
SYR 20CC LL (SYRINGE) ×6 IMPLANT
SYR TB 1ML LUER SLIP (SYRINGE) IMPLANT
TOWEL OR 17X24 6PK STRL BLUE (TOWEL DISPOSABLE) ×3 IMPLANT
TOWEL OR 17X26 10 PK STRL BLUE (TOWEL DISPOSABLE) ×3 IMPLANT
TRAY CATH 16FR W/PLASTIC CATH (SET/KITS/TRAYS/PACK) ×3 IMPLANT
WRAP KNEE MAXI GEL POST OP (GAUZE/BANDAGES/DRESSINGS) ×3 IMPLANT

## 2016-03-01 NOTE — Op Note (Signed)
TOTAL KNEE REPLACEMENT OPERATIVE NOTE:  03/01/2016  12:34 PM  PATIENT:  Rachel Green  76 y.o. female  PRE-OPERATIVE DIAGNOSIS:  primary osteoarthritis right knee  POST-OPERATIVE DIAGNOSIS:  primary osteoarthritis right knee  PROCEDURE:  Procedure(s): RIGHT TOTAL KNEE ARTHROPLASTY  SURGEON:  Surgeon(s): Vickey Huger, MD  PHYSICIAN ASSISTANT: Carlyon Shadow, PAC   ANESTHESIA:   spinal  DRAINS: Hemovac  SPECIMEN: None  COUNTS:  Correct  TOURNIQUET:   Total Tourniquet Time Documented: Thigh (Right) - 39 minutes Total: Thigh (Right) - 39 minutes   DICTATION:  Indication for procedure:    The patient is a 76 y.o. female who has failed conservative treatment for primary osteoarthritis right knee.  Informed consent was obtained prior to anesthesia. The risks versus benefits of the operation were explain and in a way the patient can, and did, understand.   On the implant demand matching protocol, this patient scored 9.  Therefore, this patient was not receive a polyethylene insert with vitamin E which is a high demand implant.  Description of procedure:     The patient was taken to the operating room and placed under anesthesia.  The patient was positioned in the usual fashion taking care that all body parts were adequately padded and/or protected.  I foley catheter was not placed.  A tourniquet was applied and the leg prepped and draped in the usual sterile fashion.  The extremity was exsanguinated with the esmarch and tourniquet inflated to 350 mmHg.  Pre-operative range of motion was normal.  The knee was in 5 degree of mild varus.  A midline incision approximately 6-7 inches long was made with a #10 blade.  A new blade was used to make a parapatellar arthrotomy going 2-3 cm into the quadriceps tendon, over the patella, and alongside the medial aspect of the patellar tendon.  A synovectomy was then performed with the #10 blade and forceps. I then elevated the deep MCL off the  medial tibial metaphysis subperiosteally around to the semimembranosus attachment.    I everted the patella and used calipers to measure patellar thickness.  I used the reamer to ream down to appropriate thickness to recreate the native thickness.  I then removed excess bone with the rongeur and sagittal saw.  I used the appropriately sized template and drilled the three lug holes.  I then put the trial in place and measured the thickness with the calipers to ensure recreation of the native thickness.  The trial was then removed and the patella subluxed and the knee brought into flexion.  A homan retractor was place to retract and protect the patella and lateral structures.  A Z-retractor was place medially to protect the medial structures.  The extra-medullary alignment system was used to make cut the tibial articular surface perpendicular to the anamotic axis of the tibia and in 3 degrees of posterior slope.  The cut surface and alignment jig was removed.  I then used the intramedullary alignment guide to make a 5 valgus cut on the distal femur.  I then marked out the epicondylar axis on the distal femur.  The posterior condylar axis measured 3 degrees.  I then used the anterior referencing sizer and measured the femur to be a size 7.  The 4-In-1 cutting block was screwed into place in external rotation matching the posterior condylar angle, making our cuts perpendicular to the epicondylar axis.  Anterior, posterior and chamfer cuts were made with the sagittal saw.  The cutting block and cut pieces  were removed.  A lamina spreader was placed in 90 degrees of flexion.  The ACL, PCL, menisci, and posterior condylar osteophytes were removed.  A 10 mm spacer blocked was found to offer good flexion and extension gap balance after minimal in degree releasing.   The scoop retractor was then placed and the femoral finishing block was pinned in place.  The small sagittal saw was used as well as the lug drill to  finish the femur.  The block and cut surfaces were removed and the medullary canal hole filled with autograft bone from the cut pieces.  The tibia was delivered forward in deep flexion and external rotation.  A size D tray was selected and pinned into place centered on the medial 1/3 of the tibial tubercle.  The reamer and keel was used to prepare the tibia through the tray.    I then trialed with the size 7 femur, size D tibia, a 10 mm insert and the 32 patella.  I had excellent flexion/extension gap balance, excellent patella tracking.  Flexion was full and beyond 120 degrees; extension was zero.  These components were chosen and the staff opened them to me on the back table while the knee was lavaged copiously and the cement mixed.  The soft tissue was infiltrated with 60cc of exparel 1.3% through a 21 gauge needle.  I cemented in the components and removed all excess cement.  The polyethylene tibial component was snapped into place and the knee placed in extension while cement was hardening.  The capsule was infilltrated with 30cc of .25% Marcaine with epinephrine.  A hemovac was place in the joint exiting superolaterally.  A pain pump was place superomedially superficial to the arthrotomy.  Once the cement was hard, the tourniquet was let down.  Hemostasis was obtained.  The arthrotomy was closed with figure-8 #1 vicryl sutures.  The deep soft tissues were closed with #0 vicryls and the subcuticular layer closed with a running #2-0 vicryl.  The skin was reapproximated and closed with skin staples.  The wound was dressed with xeroform, 4 x4's, 2 ABD sponges, a single layer of webril and a TED stocking.   The patient was then awakened, extubated, and taken to the recovery room in stable condition.  BLOOD LOSS:  300cc DRAINS: 1 hemovac, 1 pain catheter COMPLICATIONS:  None.  PLAN OF CARE: Admit to inpatient   PATIENT DISPOSITION:  PACU - hemodynamically stable.   Delay start of Pharmacological  VTE agent (>24hrs) due to surgical blood loss or risk of bleeding:  not applicable  Please fax a copy of this op note to my office at [phone] (please only include page 1 and 2 of the Case Information op note)

## 2016-03-01 NOTE — Transfer of Care (Signed)
Immediate Anesthesia Transfer of Care Note  Patient: Rachel Green  Procedure(s) Performed: Procedure(s): RIGHT TOTAL KNEE ARTHROPLASTY (Right)  Patient Location: PACU  Anesthesia Type:MAC combined with regional for post-op pain  Level of Consciousness: awake, alert , oriented and patient cooperative  Airway & Oxygen Therapy: Patient Spontanous Breathing and Patient connected to nasal cannula oxygen  Post-op Assessment: Report given to RN and Post -op Vital signs reviewed and stable  Post vital signs: Reviewed and stable  Last Vitals:  Vitals:   03/01/16 0606  BP: (!) 153/57  Pulse: (!) 53  Resp: 20  Temp: 36.6 C    Last Pain: There were no vitals filed for this visit.       Complications: No apparent anesthesia complications

## 2016-03-01 NOTE — Care Management Note (Addendum)
Case Management Note  Patient Details  Name: Rachel Green MRN: [id number] Date of Birth: 24-Nov-1940  Subjective/Objective:                    Action/Plan:  [facility]  Expected Discharge Date:  03/02/16               Expected Discharge Plan:     In-House Referral:     Discharge planning Services  CM Consult  Post Acute Care Choice:  Durable Medical Equipment, Home Health Choice offered to:     DME Arranged:    DME Agency:     HH Arranged:    HH Agency:     Status of Service:  In process, will continue to follow  If discussed at Long Length of Stay Meetings, dates discussed:    Additional Comments:  Marilu Favre, RN 03/01/2016, 2:21 PM

## 2016-03-01 NOTE — Anesthesia Procedure Notes (Signed)
Anesthesia Regional Block: Adductor canal block   Pre-Anesthetic Checklist: ,, timeout performed, Correct Patient, Correct Site, Correct Laterality, Correct Procedure, Correct Position, site marked, Risks and benefits discussed,  Surgical consent,  Pre-op evaluation,  At surgeon's request and post-op pain management  Laterality: Right  Prep: chloraprep       Needles:  Injection technique: Single-shot  Needle Type: Echogenic Needle     Needle Length: 9cm  Needle Gauge: 21     Additional Needles:   Procedures: ultrasound guided,,,,,,,,  Narrative:  Start time: 03/01/2016 7:04 AM End time: 03/01/2016 7:11 AM Injection made incrementally with aspirations every 5 mL.  Performed by: Personally  Anesthesiologist: Suzette Battiest

## 2016-03-01 NOTE — Evaluation (Signed)
Physical Therapy Evaluation Patient Details Name: Rachel Green MRN: [id number] DOB: [birth year] Today's Date: 03/01/2016   History of Present Illness  Ptis a 76 y.o. female now s/p Rt TKA. PMH: Lt TKA, HTN, neuropathy, asthma.   Clinical Impression  Pt is s/p TKA resulting in the deficits listed below (see PT Problem List). Pt able to ambulate 15 ft with rw during initial PT session. Pt will benefit from skilled PT to increase their independence and safety with mobility to allow discharge to home with spouse to assist.      Follow Up Recommendations Home health PT;Supervision for mobility/OOB    Equipment Recommendations  None recommended by PT    Recommendations for Other Services       Precautions / Restrictions Precautions Precautions: Knee;Fall Precaution Booklet Issued: Yes (comment) Precaution Comments: HEP provided, reviewed knee extension precautions Restrictions Weight Bearing Restrictions: Yes RLE Weight Bearing: Weight bearing as tolerated      Mobility  Bed Mobility Overal bed mobility: Needs Assistance Bed Mobility: Supine to Sit     Supine to sit: Mod assist (at trunk)        Transfers Overall transfer level: Needs assistance Equipment used: Rolling walker (2 wheeled) Transfers: Sit to/from Stand Sit to Stand: Min assist         General transfer comment: cues for hand placement and to stabilize rw.   Ambulation/Gait Ambulation/Gait assistance: Min guard Ambulation Distance (Feet): 15 Feet Assistive device: Rolling walker (2 wheeled) Gait Pattern/deviations: Step-to pattern;Decreased stance time - right;Decreased weight shift to right Gait velocity: decreaed   General Gait Details: slow pattern, cues for sequence, occasional difficulty advancing Rt LE.   Stairs            Wheelchair Mobility    Modified Rankin (Stroke Patients Only)       Balance Overall balance assessment: Needs assistance Sitting-balance support: No upper  extremity supported Sitting balance-Leahy Scale: Good     Standing balance support: Bilateral upper extremity supported Standing balance-Leahy Scale: Poor Standing balance comment: using rw                             Pertinent Vitals/Pain Pain Assessment: Faces Faces Pain Scale: Hurts little more (with mobility, 0/10 at rest) Pain Location: Rt knee Pain Descriptors / Indicators: Grimacing;Guarding Pain Intervention(s): Limited activity within patient's tolerance;Monitored during session    Home Living Family/patient expects to be discharged to:: Private residence Living Arrangements: Spouse/significant other Available Help at Discharge: Family;Available 24 hours/day Type of Home: House Home Access: Ramped entrance     Home Layout: One level Home Equipment: Walker - 2 wheels;Cane - single point;Bedside commode      Prior Function Level of Independence: Independent with assistive device(s)         Comments: using rw PTA     Hand Dominance        Extremity/Trunk Assessment   Upper Extremity Assessment Upper Extremity Assessment: Overall WFL for tasks assessed    Lower Extremity Assessment Lower Extremity Assessment: RLE deficits/detail RLE Deficits / Details: able to perform SLR       Communication   Communication: No difficulties  Cognition Arousal/Alertness: Awake/alert Behavior During Therapy: WFL for tasks assessed/performed Overall Cognitive Status: Within Functional Limits for tasks assessed                      General Comments      Exercises  Assessment/Plan    PT Assessment Patient needs continued PT services  PT Problem List Decreased strength;Decreased range of motion;Decreased activity tolerance;Decreased balance;Decreased mobility       PT Treatment Interventions DME instruction;Gait training;Stair training;Functional mobility training;Therapeutic activities;Therapeutic exercise;Balance training;Patient/family  education    PT Goals (Current goals can be found in the Care Plan section)  Acute Rehab PT Goals Patient Stated Goal: get back home PT Goal Formulation: With patient Time For Goal Achievement: 03/15/16 Potential to Achieve Goals: Good    Frequency 7X/week   Barriers to discharge        Co-evaluation               End of Session Equipment Utilized During Treatment: Gait belt Activity Tolerance: Patient tolerated treatment well Patient left: in chair;with call bell/phone within reach;with family/visitor present (in knee extension) Nurse Communication: Mobility status PT Visit Diagnosis: Unsteadiness on feet (R26.81);Muscle weakness (generalized) (M62.81);Difficulty in walking, not elsewhere classified (R26.2)    Functional Assessment Tool Used: AM-PAC 6 Clicks Basic Mobility;Clinical judgement Functional Limitation: Mobility: Walking and moving around Mobility: Walking and Moving Around Current Status [id number]): At least 60 percent but less than 80 percent impaired, limited or restricted Mobility: Walking and Moving Around Goal Status [phone]): At least 20 percent but less than 40 percent impaired, limited or restricted    Time: [id number] PT Time Calculation (min) (ACUTE ONLY): 26 min   Charges:   PT Evaluation $PT Eval Moderate Complexity: 1 Procedure PT Treatments $Gait Training: 8-22 mins   PT G Codes:   PT G-Codes **NOT FOR INPATIENT CLASS** Functional Assessment Tool Used: AM-PAC 6 Clicks Basic Mobility;Clinical judgement Functional Limitation: Mobility: Walking and moving around Mobility: Walking and Moving Around Current Status [id number]): At least 60 percent but less than 80 percent impaired, limited or restricted Mobility: Walking and Moving Around Goal Status [phone]): At least 20 percent but less than 40 percent impaired, limited or restricted     Cassell Clement, PT, [place] Pager [phone] Office [phone]  03/01/2016, 3:28 PM

## 2016-03-01 NOTE — Progress Notes (Signed)
Orthopedic Tech Progress Note Patient Details:  Rachel Green [birth year] [id number]  CPM Right Knee CPM Right Knee: On Right Knee Flexion (Degrees): 90 Right Knee Extension (Degrees): 0 Additional Comments: Foot roll   [name] 03/01/2016, 10:24 AM

## 2016-03-01 NOTE — Anesthesia Procedure Notes (Addendum)
Spinal  Patient location during procedure: OR Start time: 03/01/2016 7:37 AM End time: 03/01/2016 7:42 AM Staffing Anesthesiologist: Suzette Battiest Performed: anesthesiologist  Preanesthetic Checklist Completed: patient identified, site marked, surgical consent, pre-op evaluation, timeout performed, IV checked, risks and benefits discussed and monitors and equipment checked Spinal Block Patient position: sitting Prep: site prepped and draped and DuraPrep Patient monitoring: heart rate, continuous pulse ox and blood pressure Approach: midline Location: L4-5 Injection technique: single-shot Needle Needle type: Pencan  Needle gauge: 24 G Needle length: 9 cm Needle insertion depth: 6 cm

## 2016-03-01 NOTE — Addendum Note (Signed)
Addendum  created 03/01/16 1414 by Oletta Lamas, CRNA   Anesthesia Intra Meds edited

## 2016-03-01 NOTE — Anesthesia Postprocedure Evaluation (Signed)
Anesthesia Post Note  Patient: Rachel Green  Procedure(s) Performed: Procedure(s) (LRB): RIGHT TOTAL KNEE ARTHROPLASTY (Right)  Patient location during evaluation: SICU Anesthesia Type: Spinal Level of consciousness: sedated Pain management: pain level controlled Vital Signs Assessment: post-procedure vital signs reviewed and stable Respiratory status: patient remains intubated per anesthesia plan Cardiovascular status: stable Anesthetic complications: no       Last Vitals:  Vitals:   03/01/16 1015 03/01/16 1030  BP: (!) 177/75 (!) 176/95  Pulse: (!) 53 (!) 55  Resp: 14 16  Temp:      Last Pain: There were no vitals filed for this visit.          L Sensory Level: L5-Outer lower leg, top of foot, great toe (03/01/16 1030) R Sensory Level: L3-Anterior knee, lower leg (03/01/16 1030)  Tiajuana Amass

## 2016-03-02 ENCOUNTER — Encounter (HOSPITAL_COMMUNITY): Payer: Self-pay | Admitting: Orthopedic Surgery

## 2016-03-02 DIAGNOSIS — J45909 Unspecified asthma, uncomplicated: Secondary | ICD-10-CM | POA: Diagnosis not present

## 2016-03-02 DIAGNOSIS — E039 Hypothyroidism, unspecified: Secondary | ICD-10-CM | POA: Diagnosis not present

## 2016-03-02 DIAGNOSIS — I10 Essential (primary) hypertension: Secondary | ICD-10-CM | POA: Diagnosis not present

## 2016-03-02 DIAGNOSIS — M1711 Unilateral primary osteoarthritis, right knee: Secondary | ICD-10-CM | POA: Diagnosis not present

## 2016-03-02 DIAGNOSIS — K219 Gastro-esophageal reflux disease without esophagitis: Secondary | ICD-10-CM | POA: Diagnosis not present

## 2016-03-02 DIAGNOSIS — H903 Sensorineural hearing loss, bilateral: Secondary | ICD-10-CM | POA: Diagnosis not present

## 2016-03-02 LAB — BASIC METABOLIC PANEL
Anion gap: 9 (ref 5–15)
BUN: 14 mg/dL (ref 6–20)
CALCIUM: 9.1 mg/dL (ref 8.9–10.3)
CO2: 26 mmol/L (ref 22–32)
Chloride: 104 mmol/L (ref 101–111)
Creatinine, Ser: 0.97 mg/dL (ref 0.44–1.00)
GFR calc non Af Amer: 56 mL/min — ABNORMAL LOW (ref >60)
Glucose, Bld: 120 mg/dL — ABNORMAL HIGH (ref 65–99)
Potassium: 4.1 mmol/L (ref 3.5–5.1)
SODIUM: 139 mmol/L (ref 135–145)

## 2016-03-02 LAB — CBC
HCT: 31.9 % — ABNORMAL LOW (ref 36.0–46.0)
HEMOGLOBIN: 10.5 g/dL — AB (ref 12.0–15.0)
MCH: 29.6 pg (ref 26.0–34.0)
MCHC: 32.9 g/dL (ref 30.0–36.0)
MCV: 89.9 fL (ref 78.0–100.0)
Platelets: 195 10*3/uL (ref 150–400)
RBC: 3.55 MIL/uL — ABNORMAL LOW (ref 3.87–5.11)
RDW: 14.5 % (ref 11.5–15.5)
WBC: 18.3 10*3/uL — AB (ref 4.0–10.5)

## 2016-03-02 MED ORDER — OXYCODONE HCL 10 MG PO TABS: 10.0000 mg | ORAL_TABLET | ORAL | 0 refills | Status: AC | PRN

## 2016-03-02 MED ORDER — METHOCARBAMOL 500 MG PO TABS: 500.0000 mg | ORAL_TABLET | Freq: Four times a day (QID) | ORAL | 0 refills | Status: AC | PRN

## 2016-03-02 MED ORDER — ASPIRIN 325 MG PO TBEC: 325.0000 mg | DELAYED_RELEASE_TABLET | Freq: Two times a day (BID) | ORAL | 0 refills | Status: AC

## 2016-03-02 NOTE — Progress Notes (Signed)
Orthopedic Tech Progress Note Patient Details:  Rachel Green Nov 10, 1940 [id number]  CPM Right Knee CPM Right Knee: On Right Knee Flexion (Degrees): 90 Right Knee Extension (Degrees): 0 Additional Comments: Foot roll   [name] 03/02/2016, 11:45 AM

## 2016-03-02 NOTE — Care Management Obs Status (Signed)
[facility] NOTIFICATION   Patient Details  Name: Gionna Ecke MRN: [id number] Date of Birth: July 24, 1940   Medicare Observation Status Notification Given:  Yes    Carles Collet, RN 03/02/2016, 12:54 PM

## 2016-03-02 NOTE — Progress Notes (Signed)
Physical Therapy Treatment Patient Details Name: Rachel Green MRN: [id number] DOB: 1940-04-15 Today's Date: 03/02/2016    History of Present Illness Ptis a 76 y.o. female now s/p Rt TKA. PMH: Lt TKA, HTN, neuropathy, asthma.     PT Comments    Patient able to ambulate 19ft with min guard this session. Pt unsteady upon standing which pt reported has been an issue for "a while". Husband present for session. Current plan remains appropriate.    Follow Up Recommendations  Home health PT;Supervision for mobility/OOB     Equipment Recommendations  None recommended by PT    Recommendations for Other Services       Precautions / Restrictions Precautions Precautions: Knee;Fall Precaution Booklet Issued: Yes (comment) Precaution Comments: HEP provided, reviewed knee extension precautions Restrictions Weight Bearing Restrictions: Yes RLE Weight Bearing: Weight bearing as tolerated    Mobility  Bed Mobility Overal bed mobility: Needs Assistance Bed Mobility: Sit to Supine       Sit to supine: Min guard   General bed mobility comments: sitting EOB with husband  Transfers Overall transfer level: Needs assistance Equipment used: Rolling walker (2 wheeled) Transfers: Sit to/from Stand Sit to Stand: Min assist         General transfer comment: pt able to stand without assist but needs assist upon standing due to posterior lean  Ambulation/Gait Ambulation/Gait assistance: Min guard Ambulation Distance (Feet): 150 Feet Assistive device: Rolling walker (2 wheeled) Gait Pattern/deviations: Step-through pattern;Decreased stride length;Decreased weight shift to right     General Gait Details: mildly antalgic gait; cues for posture and cadence; pt has very fast pace and became SOB; standing rest breaks X2 required   Stairs            Wheelchair Mobility    Modified Rankin (Stroke Patients Only)       Balance Overall balance assessment: Needs  assistance Sitting-balance support: No upper extremity supported Sitting balance-Leahy Scale: Good     Standing balance support: Bilateral upper extremity supported Standing balance-Leahy Scale: Poor Standing balance comment: strong posterior lean initially requiring mod A for correction                    Cognition Arousal/Alertness: Awake/alert Behavior During Therapy: WFL for tasks assessed/performed Overall Cognitive Status: Within Functional Limits for tasks assessed                      Exercises Total Joint Exercises Quad Sets: AROM;Both;10 reps Short Arc Quad: AROM;Right;10 reps Heel Slides: AROM;Right;10 reps Hip ABduction/ADduction: AROM;Right;10 reps Straight Leg Raises: AROM;Right;10 reps Long Arc Quad: AROM;Right;10 reps Knee Flexion: AROM;Right;5 reps;Seated;Other (comment) (10 sec holds) Goniometric ROM: 0-90    General Comments General comments (skin integrity, edema, etc.): husband present      Pertinent Vitals/Pain Pain Assessment: Faces Faces Pain Scale: Hurts a little bit Pain Location: Rt knee Pain Descriptors / Indicators: Sore Pain Intervention(s): Monitored during session;Premedicated before session;Repositioned    Home Living Family/patient expects to be discharged to:: Private residence Living Arrangements: Spouse/significant other Available Help at Discharge: Family;Available 24 hours/day Type of Home: House Home Access: Ramped entrance   Home Layout: One level Home Equipment: Walker - 2 wheels;Cane - single point;Bedside commode Additional Comments: prior TKA    Prior Function Level of Independence: Independent with assistive device(s)      Comments: using rw PTA   PT Goals (current goals can now be found in the care plan section) Acute Rehab PT Goals Patient  Stated Goal: get back home PT Goal Formulation: With patient Time For Goal Achievement: 03/15/16 Potential to Achieve Goals: Good Progress towards PT goals:  Progressing toward goals    Frequency    7X/week      PT Plan Current plan remains appropriate    Co-evaluation             End of Session Equipment Utilized During Treatment: Gait belt Activity Tolerance: Patient tolerated treatment well Patient left: with call bell/phone within reach;with family/visitor present (sitting EOB) Nurse Communication: Mobility status       Time: [id number] PT Time Calculation (min) (ACUTE ONLY): 14 min  Charges:  $Gait Training: 8-22 mins                     G Codes:       Salina April, PTA Pager: [phone]   03/02/2016, 4:34 PM

## 2016-03-02 NOTE — Discharge Summary (Signed)
SPORTS MEDICINE & JOINT REPLACEMENT   Lara Mulch, MD   Carlyon Shadow, PA-C [address], [place], [place]  [address]                             [phone]  PATIENT ID: Rachel Green        MRN:  [id number]          DOB/AGE: 1940/04/08 / 76 y.o.    DISCHARGE SUMMARY  ADMISSION DATE:    03/01/2016 DISCHARGE DATE:   03/02/2016   ADMISSION DIAGNOSIS: primary osteoarthritis right knee    DISCHARGE DIAGNOSIS:  primary osteoarthritis right knee    ADDITIONAL DIAGNOSIS: Active Problems:   S/P total knee replacement  Past Medical History:  Diagnosis Date  . Arthritis   . Asthma   . Cancer [facility])    Skin cancer- face- right  . Ecchymosis   . GERD (gastroesophageal reflux disease)   . Heart murmur    "years ago" not now  . Hemorrhoid   . Hiatal hernia   . HTN (hypertension)   . Hypothyroidism   . Multiple allergies    grass, Pecan trees  . Neuropathy ([place])   . 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 Restrictive lung disease   . Shortness of breath    with activity  . Sinus headache   . Thyroid disease    with goiter    PROCEDURE: Procedure(s): RIGHT TOTAL KNEE ARTHROPLASTY on 03/01/2016  CONSULTS:    HISTORY:  See H&P in chart  HOSPITAL COURSE:  Bonniejean Parada is a 76 y.o. admitted on 03/01/2016 and found to have a diagnosis of primary osteoarthritis right knee.  After appropriate laboratory studies were obtained  they were taken to the operating room on 03/01/2016 and underwent Procedure(s): RIGHT TOTAL KNEE ARTHROPLASTY.   They were given perioperative antibiotics:  Anti-infectives    Start     Dose/Rate Route Frequency Ordered Stop   03/01/16 1500  ceFAZolin (ANCEF) IVPB 1 g/50 mL premix     1 g 100 mL/hr over 30 Minutes Intravenous Every 6 hours 03/01/16 1134 03/01/16 2131   03/01/16 0547  vancomycin (VANCOCIN) IVPB 1000 mg/200 mL premix  Status:  Discontinued     1,000 mg 200 mL/hr over 60 Minutes Intravenous On call to O.R. 03/01/16 0547 03/01/16 1133     .  Patient given tranexamic acid IV or topical and exparel intra-operatively.  Tolerated the procedure well.    POD# 1: Vital signs were stable.  Patient denied Chest pain, shortness of breath, or calf pain.  Patient was started on Lovenox 30 mg subcutaneously twice daily at 8am.  Consults to PT, OT, and care management were made.  The patient was weight bearing as tolerated.  CPM was placed on the operative leg 0-90 degrees for 6-8 hours a day. When out of the CPM, patient was placed in the foam block to achieve full extension. Incentive spirometry was taught.  Dressing was changed.       POD #2, Continued  PT for ambulation and exercise program.  IV saline locked.  O2 discontinued.    The remainder of the hospital course was dedicated to ambulation and strengthening.   The patient was discharged on 1 Day Post-Op in  Good condition.  Blood products given:none  DIAGNOSTIC STUDIES: Recent vital signs: Patient Vitals for the past 24 hrs:  BP Temp Temp src Pulse Resp SpO2  03/02/16 0830 (!)  152/58 98.4 F (36.9 C) Oral 66 - 97 %  03/02/16 0417 138/65 97.7 F (36.5 C) Oral 62 16 96 %  03/02/16 0016 (!) 133/59 97.5 F (36.4 C) Oral 62 16 97 %  03/01/16 1957 (!) 136/58 97.5 F (36.4 C) Oral (!) 58 16 97 %  03/01/16 1946 - - - - - 98 %  03/01/16 1500 (!) 160/94 97.1 F (36.2 C) Oral 64 16 97 %       Recent laboratory studies:  Recent Labs  03/02/16 0606  WBC 18.3*  HGB 10.5*  HCT 31.9*  PLT 195    Recent Labs  03/02/16 0606  NA 139  K 4.1  CL 104  CO2 26  BUN 14  CREATININE 0.97  GLUCOSE 120*  CALCIUM 9.1   Lab Results  Component Value Date   INR 0.96 10/12/2011     Recent Radiographic Studies :  No results found.  DISCHARGE INSTRUCTIONS: Discharge Instructions    CPM    Complete by:  As directed    Continuous passive motion machine (CPM):      Use the CPM from 0 to 90 for 4-6 hours per day.      You may increase by 10 per day.  You may break it up into  2 or 3 sessions per day.      Use CPM for 2 weeks or until you are told to stop.   Call MD / Call 911    Complete by:  As directed    If you experience chest pain or shortness of breath, CALL 911 and be transported to the hospital emergency room.  If you develope a fever above 101 F, pus (white drainage) or increased drainage or redness at the wound, or calf pain, call your surgeon's office.   Compression stockings    Complete by:  As directed    Constipation Prevention    Complete by:  As directed    Drink plenty of fluids.  Prune juice may be helpful.  You may use a stool softener, such as Colace (over the counter) 100 mg twice a day.  Use MiraLax (over the counter) for constipation as needed.   Diet - low sodium heart healthy    Complete by:  As directed    Discharge instructions    Complete by:  As directed    INSTRUCTIONS AFTER JOINT REPLACEMENT   Remove items at home which could result in a fall. This includes throw rugs or furniture in walking pathways ICE to the affected joint every three hours while awake for 30 minutes at a time, for at least the first 3-5 days, and then as needed for pain and swelling.  Continue to use ice for pain and swelling. You may notice swelling that will progress down to the foot and ankle.  This is normal after surgery.  Elevate your leg when you are not up walking on it.   Continue to use the breathing machine you got in the hospital (incentive spirometer) which will help keep your temperature down.  It is common for your temperature to cycle up and down following surgery, especially at night when you are not up moving around and exerting yourself.  The breathing machine keeps your lungs expanded and your temperature down.   DIET:  As you were doing prior to hospitalization, we recommend a well-balanced diet.  DRESSING / WOUND CARE / SHOWERING  Keep the surgical dressing until follow up.  The dressing is water  proof, so you can shower without any extra  covering.  IF THE DRESSING FALLS OFF or the wound gets wet inside, change the dressing with sterile gauze.  Please use good hand washing techniques before changing the dressing.  Do not use any lotions or creams on the incision until instructed by your surgeon.    ACTIVITY  Increase activity slowly as tolerated, but follow the weight bearing instructions below.   No driving for 6 weeks or until further direction given by your physician.  You cannot drive while taking narcotics.  No lifting or carrying greater than 10 lbs. until further directed by your surgeon. Avoid periods of inactivity such as sitting longer than an hour when not asleep. This helps prevent blood clots.  You may return to work once you are authorized by your doctor.     WEIGHT BEARING   Weight bearing as tolerated with assist device (walker, cane, etc) as directed, use it as long as suggested by your surgeon or therapist, typically at least 4-6 weeks.   EXERCISES  Results after joint replacement surgery are often greatly improved when you follow the exercise, range of motion and muscle strengthening exercises prescribed by your doctor. Safety measures are also important to protect the joint from further injury. Any time any of these exercises cause you to have increased pain or swelling, decrease what you are doing until you are comfortable again and then slowly increase them. If you have problems or questions, call your caregiver or physical therapist for advice.   Rehabilitation is important following a joint replacement. After just a few days of immobilization, the muscles of the leg can become weakened and shrink (atrophy).  These exercises are designed to build up the tone and strength of the thigh and leg muscles and to improve motion. Often times heat used for twenty to thirty minutes before working out will loosen up your tissues and help with improving the range of motion but do not use heat for the first two weeks  following surgery (sometimes heat can increase post-operative swelling).   These exercises can be done on a training (exercise) mat, on the floor, on a table or on a bed. Use whatever works the best and is most comfortable for you.    Use music or television while you are exercising so that the exercises are a pleasant break in your day. This will make your life better with the exercises acting as a break in your routine that you can look forward to.   Perform all exercises about fifteen times, three times per day or as directed.  You should exercise both the operative leg and the other leg as well.   Exercises include:   Quad Sets - Tighten up the muscle on the front of the thigh (Quad) and hold for 5-10 seconds.   Straight Leg Raises - With your knee straight (if you were given a brace, keep it on), lift the leg to 60 degrees, hold for 3 seconds, and slowly lower the leg.  Perform this exercise against resistance later as your leg gets stronger.  Leg Slides: Lying on your back, slowly slide your foot toward your buttocks, bending your knee up off the floor (only go as far as is comfortable). Then slowly slide your foot back down until your leg is flat on the floor again.  Angel Wings: Lying on your back spread your legs to the side as far apart as you can without causing discomfort.  Hamstring Strength:  Lying on your back, push your heel against the floor with your leg straight by tightening up the muscles of your buttocks.  Repeat, but this time bend your knee to a comfortable angle, and push your heel against the floor.  You may put a pillow under the heel to make it more comfortable if necessary.   A rehabilitation program following joint replacement surgery can speed recovery and prevent re-injury in the future due to weakened muscles. Contact your doctor or a physical therapist for more information on knee rehabilitation.    CONSTIPATION  Constipation is defined medically as fewer than three  stools per week and severe constipation as less than one stool per week.  Even if you have a regular bowel pattern at home, your normal regimen is likely to be disrupted due to multiple reasons following surgery.  Combination of anesthesia, postoperative narcotics, change in appetite and fluid intake all can affect your bowels.   YOU MUST use at least one of the following options; they are listed in order of increasing strength to get the job done.  They are all available over the counter, and you may need to use some, POSSIBLY even all of these options:    Drink plenty of fluids (prune juice may be helpful) and high fiber foods Colace 100 mg by mouth twice a day  Senokot for constipation as directed and as needed Dulcolax (bisacodyl), take with full glass of water  Miralax (polyethylene glycol) once or twice a day as needed.  If you have tried all these things and are unable to have a bowel movement in the first 3-4 days after surgery call either your surgeon or your primary doctor.    If you experience loose stools or diarrhea, hold the medications until you stool forms back up.  If your symptoms do not get better within 1 week or if they get worse, check with your doctor.  If you experience "the worst abdominal pain ever" or develop nausea or vomiting, please contact the office immediately for further recommendations for treatment.   ITCHING:  If you experience itching with your medications, try taking only a single pain pill, or even half a pain pill at a time.  You can also use Benadryl over the counter for itching or also to help with sleep.   TED HOSE STOCKINGS:  Use stockings on both legs until for at least 2 weeks or as directed by physician office. They may be removed at night for sleeping.  MEDICATIONS:  See your medication summary on the "After Visit Summary" that nursing will review with you.  You may have some home medications which will be placed on hold until you complete the course  of blood thinner medication.  It is important for you to complete the blood thinner medication as prescribed.  PRECAUTIONS:  If you experience chest pain or shortness of breath - call 911 immediately for transfer to the hospital emergency department.   If you develop a fever greater that 101 F, purulent drainage from wound, increased redness or drainage from wound, foul odor from the wound/dressing, or calf pain - CONTACT YOUR SURGEON.                                                   FOLLOW-UP APPOINTMENTS:  If you do not already have a post-op  appointment, please call the office for an appointment to be seen by your surgeon.  Guidelines for how soon to be seen are listed in your "After Visit Summary", but are typically between 1-4 weeks after surgery.  OTHER INSTRUCTIONS:   Knee Replacement:  Do not place pillow under knee, focus on keeping the knee straight while resting. CPM instructions: 0-90 degrees, 2 hours in the morning, 2 hours in the afternoon, and 2 hours in the evening. Place foam block, curve side up under heel at all times except when in CPM or when walking.  DO NOT modify, tear, cut, or change the foam block in any way.  MAKE SURE YOU:  Understand these instructions.  Get help right away if you are not doing well or get worse.    Thank you for letting us be a part of your medical care team.  It is a privilege we respect greatly.  We hope these instructions will help you stay on track for a fast and full recovery!   Increase activity slowly as tolerated    Complete by:  As directed       DISCHARGE MEDICATIONS:   Allergies as of 03/02/2016      Reactions   Oxybutynin Chloride Anaphylaxis, Other (See Comments)   Throat Swelling Throat Swelling Per documents sent over - patient denies   Azithromycin Rash, Hives   Emetrol Rash   Penicillins Diarrhea, Nausea And Vomiting   Has patient had a PCN reaction causing immediate rash, facial/tongue/throat swelling, SOB or  lightheadedness with hypotension: No Has patient had a PCN reaction causing severe rash involving mucus membranes or skin necrosis: No Has patient had a PCN reaction that required hospitalization No Has patient had a PCN reaction occurring within the last 10 years: No If all of the above answers are "NO", then may proceed with Cephalosporin use.   Petrolatum-zinc Oxide Rash   Throat Swelling Throat Swelling Throat Swelling Throat Swelling Throat Swelling   Sulfa Antibiotics Rash   Ampicillin Other (See Comments)   Hands tingle and hurt   Levofloxacin Nausea Only      Medication List    STOP taking these medications   meloxicam 15 MG tablet Commonly known as:  MOBIC   methylPREDNISolone 4 MG Tbpk tablet Commonly known as:  MEDROL DOSEPAK     TAKE these medications   aspirin 325 MG EC tablet Take 1 tablet (325 mg total) by mouth 2 (two) times daily. What changed:  medication strength  how much to take  when to take this   atorvastatin 40 MG tablet Commonly known as:  LIPITOR Take 40 mg by mouth at bedtime.   beclomethasone 80 MCG/ACT inhaler Commonly known as:  QVAR Inhale 2 puffs into the lungs 2 (two) times daily.   calcium-vitamin D 500-200 MG-UNIT tablet Commonly known as:  OSCAL WITH D Take 1 tablet by mouth 2 (two) times daily.   fish oil-omega-3 fatty acids 1000 MG capsule Take 2 g by mouth 2 (two) times daily.   furosemide 40 MG tablet Commonly known as:  LASIX Take 40 mg by mouth daily.   levothyroxine 25 MCG tablet Commonly known as:  SYNTHROID, LEVOTHROID Take 25 mcg by mouth daily before breakfast.   lisinopril 40 MG tablet Commonly known as:  PRINIVIL,ZESTRIL Take 40 mg by mouth daily.   loratadine-pseudoephedrine 10-240 MG 24 hr tablet Commonly known as:  CLARITIN-D 24-hour Take 1 tablet by mouth daily as needed for allergies.   methocarbamol 500 MG  tablet Commonly known as:  ROBAXIN Take 1-2 tablets (500-1,000 mg total) by mouth  every 6 (six) hours as needed for muscle spasms.   multivitamin with minerals Tabs tablet Take 1 tablet by mouth daily.   MYRBETRIQ 50 MG Tb24 tablet Generic drug:  mirabegron ER Take 50 mg by mouth daily.   omeprazole 20 MG capsule Commonly known as:  PRILOSEC Take 20 mg by mouth 2 (two) times daily.   Oxycodone HCl 10 MG Tabs Take 1 tablet (10 mg total) by mouth every 3 (three) hours as needed for breakthrough pain.   PARoxetine 20 MG tablet Commonly known as:  PAXIL Take 20 mg by mouth daily.   potassium chloride SA 20 MEQ tablet Commonly known as:  K-DUR,KLOR-CON Take 20 mEq by mouth daily.   propranolol 20 MG tablet Commonly known as:  INDERAL TAKE 1 TABLET ONCE DAILY   vitamin B-1 250 MG tablet Take 250 mg by mouth daily.            Durable Medical Equipment        Start     Ordered   03/01/16 1135  DME Walker rolling  Once    Question:  Patient needs a walker to treat with the following condition  Answer:  S/P total knee replacement   03/01/16 1134   03/01/16 1135  DME 3 n 1  Once     03/01/16 1134   03/01/16 1135  DME Bedside commode  Once    Question:  Patient needs a bedside commode to treat with the following condition  Answer:  S/P total knee replacement   03/01/16 1134      FOLLOW UP VISIT:    DISPOSITION: HOME VS. SNF  CONDITION:  Good   Donia Ast 03/02/2016, 1:29 PM

## 2016-03-02 NOTE — Evaluation (Signed)
Occupational Therapy Evaluation and Discharge Patient Details Name: Rachel Green MRN: [id number] DOB: 1940/11/01 Today's Date: 03/02/2016    History of Present Illness Ptis a 76 y.o. female now s/p Rt TKA. PMH: Lt TKA, HTN, neuropathy, asthma.    Clinical Impression   PTA Pt independent in ADL and mobility with RW. Pt currently mod A for ADL and min A for mobility with RW. Pt able to perform toilet transfer, sink level grooming. OT education complete for compensatory strategies and safety going home with husband as caregiver (willing and able). Pt and husband with no questions at end of session. OT to sign off. Thank you for this referral.     Follow Up Recommendations  No OT follow up;Supervision/Assistance - 24 hour (initially)    Equipment Recommendations  None recommended by OT (Has from previous surgery)    Recommendations for Other Services       Precautions / Restrictions Precautions Precautions: Knee;Fall Precaution Booklet Issued: Yes (comment) Precaution Comments: reviewed knee extension precautions Restrictions Weight Bearing Restrictions: Yes RLE Weight Bearing: Weight bearing as tolerated      Mobility Bed Mobility Overal bed mobility: Needs Assistance Bed Mobility: Sit to Supine       Sit to supine: Min guard   General bed mobility comments: use of bed rails, no assist needed for operated leg  Transfers Overall transfer level: Needs assistance Equipment used: Rolling walker (2 wheeled) Transfers: Sit to/from Stand Sit to Stand: Mod assist         General transfer comment: Pt with strong posterior lean initially after standing requiring mod A for correction; cues for hand placement and to stabilize rw.     Balance Overall balance assessment: Needs assistance Sitting-balance support: No upper extremity supported Sitting balance-Leahy Scale: Good     Standing balance support: Bilateral upper extremity supported Standing balance-Leahy Scale:  Poor Standing balance comment: strong posterior lean initially requiring mod A for correction                            ADL Overall ADL's : Needs assistance/impaired Eating/Feeding: Modified independent;Sitting   Grooming: Wash/dry hands;Min guard;Standing Grooming Details (indicate cue type and reason): sink level Upper Body Bathing: Set up;Sitting   Lower Body Bathing: Moderate assistance;With caregiver independent assisting;Sitting/lateral leans   Upper Body Dressing : Set up   Lower Body Dressing: Moderate assistance;With caregiver independent assisting;Sit to/from stand   Toilet Transfer: Minimal assistance;Cueing for sequencing;Ambulation;BSC;RW Toilet Transfer Details (indicate cue type and reason): BSC over toilet Toileting- Clothing Manipulation and Hygiene: Minimal assistance;With caregiver independent assisting;Sit to/from stand Toileting - Clothing Manipulation Details (indicate cue type and reason): min A for balance     Functional mobility during ADLs: Minimal assistance;Rolling walker       Vision Baseline Vision/History: Wears glasses Wears Glasses: At all times Patient Visual Report: No change from baseline Vision Assessment?: No apparent visual deficits     Perception     Praxis      Pertinent Vitals/Pain Pain Assessment: Faces Faces Pain Scale: Hurts little more Pain Location: Rt knee Pain Descriptors / Indicators: Grimacing;Sore;Tightness Pain Intervention(s): Monitored during session;Repositioned;Premedicated before session;Ice applied     Hand Dominance     Extremity/Trunk Assessment Upper Extremity Assessment Upper Extremity Assessment: Overall WFL for tasks assessed   Lower Extremity Assessment Lower Extremity Assessment: RLE deficits/detail RLE Deficits / Details: deficits in strength and ROM post-op   Cervical / Trunk Assessment Cervical / Trunk Assessment:  Normal   Communication Communication Communication: No  difficulties   Cognition Arousal/Alertness: Awake/alert Behavior During Therapy: WFL for tasks assessed/performed Overall Cognitive Status: Within Functional Limits for tasks assessed                     General Comments  Pt's husband in room for session    Exercises      Shoulder Instructions      Home Living Family/patient expects to be discharged to:: Private residence Living Arrangements: Spouse/significant other Available Help at Discharge: Family;Available 24 hours/day Type of Home: House Home Access: Ramped entrance     Home Layout: One level     Bathroom Shower/Tub: Occupational psychologist: Handicapped height Bathroom Accessibility: Yes How Accessible: Accessible via walker Home Equipment: [place] - 2 wheels;Cane - single point;Bedside commode   Additional Comments: prior TKA      Prior Functioning/Environment Level of Independence: Independent with assistive device(s)        Comments: using rw PTA        OT Problem List: Decreased strength;Decreased range of motion;Decreased activity tolerance;Impaired balance (sitting and/or standing);Decreased knowledge of precautions;Pain      OT Treatment/Interventions:      OT Goals(Current goals can be found in the care plan section) Acute Rehab OT Goals Patient Stated Goal: get back home OT Goal Formulation: With patient Time For Goal Achievement: 03/09/16 Potential to Achieve Goals: Good  OT Frequency:     Barriers to D/C:            Co-evaluation              End of Session Equipment Utilized During Treatment: Gait belt;Rolling walker CPM Right Knee CPM Right Knee: On Right Knee Flexion (Degrees): 65 Right Knee Extension (Degrees): 0 Nurse Communication: Mobility status;Weight bearing status;Other (comment) (in CPM)  Activity Tolerance: Patient tolerated treatment well Patient left: in bed;in CPM;with call bell/phone within reach;with family/visitor present  OT Visit  Diagnosis: Other abnormalities of gait and mobility (R26.89)                ADL either performed or assessed with clinical judgement  Time: [id number] OT Time Calculation (min): 30 min Charges:  OT General Charges $OT Visit: 1 Procedure OT Evaluation $OT Eval Moderate Complexity: 1 Procedure OT Treatments $Self Care/Home Management : 8-22 mins G-Codes: OT G-codes **NOT FOR INPATIENT CLASS** Functional Assessment Tool Used: AM-PAC 6 Clicks Daily Activity Functional Limitation: Self care Self Care Current Status [id number]): At least 40 percent but less than 60 percent impaired, limited or restricted Self Care Goal Status [id number]): At least 1 percent but less than 20 percent impaired, limited or restricted Self Care Discharge Status [phone]): At least 40 percent but less than 60 percent impaired, limited or restricted   Hulda Humphrey OTR/L [place] 03/02/2016, 4:22 PM

## 2016-03-02 NOTE — Progress Notes (Signed)
Physical Therapy Treatment Patient Details Name: Rachel Green MRN: [id number] DOB: 1940-03-07 Today's Date: 03/02/2016    History of Present Illness Ptis a 76 y.o. female now s/p Rt TKA. PMH: Lt TKA, HTN, neuropathy, asthma.     PT Comments    Patient tolerated therex well. Next session to focus on ambulation/transfers. Continue to progress as tolerated with anticipated d/c home with HHPT.    Follow Up Recommendations  Home health PT;Supervision for mobility/OOB     Equipment Recommendations  None recommended by PT    Recommendations for Other Services       Precautions / Restrictions Precautions Precautions: Knee;Fall Precaution Booklet Issued: Yes (comment) Precaution Comments: HEP provided, reviewed knee extension precautions Restrictions Weight Bearing Restrictions: Yes RLE Weight Bearing: Weight bearing as tolerated    Mobility  Bed Mobility               General bed mobility comments: pt OOB in chair upon arrival  Transfers                    Ambulation/Gait                 Stairs            Wheelchair Mobility    Modified Rankin (Stroke Patients Only)       Balance Overall balance assessment: Needs assistance Sitting-balance support: No upper extremity supported Sitting balance-Leahy Scale: Good                              Cognition Arousal/Alertness: Awake/alert Behavior During Therapy: WFL for tasks assessed/performed Overall Cognitive Status: Within Functional Limits for tasks assessed                      Exercises Total Joint Exercises Quad Sets: AROM;Both;10 reps Short Arc Quad: AROM;Right;10 reps Heel Slides: AROM;Right;10 reps Hip ABduction/ADduction: AROM;Right;10 reps Straight Leg Raises: AROM;Right;10 reps Long Arc Quad: AROM;Right;10 reps Knee Flexion: AROM;Right;5 reps;Seated;Other (comment) (10 sec holds) Goniometric ROM: 0-90    General Comments        Pertinent  Vitals/Pain Pain Assessment: Faces Faces Pain Scale: Hurts little more Pain Location: Rt knee Pain Descriptors / Indicators: Grimacing;Sore;Tightness Pain Intervention(s): Limited activity within patient's tolerance;Monitored during session;Premedicated before session;Repositioned    Home Living                      Prior Function            PT Goals (current goals can now be found in the care plan section) Acute Rehab PT Goals Patient Stated Goal: get back home PT Goal Formulation: With patient Time For Goal Achievement: 03/15/16 Potential to Achieve Goals: Good    Frequency    7X/week      PT Plan Current plan remains appropriate    Co-evaluation             End of Session Equipment Utilized During Treatment: Gait belt Activity Tolerance: Patient tolerated treatment well Patient left: in chair;with call bell/phone within reach;with family/visitor present (in knee extension) Nurse Communication: Mobility status       Time: [id number] PT Time Calculation (min) (ACUTE ONLY): 33 min  Charges:  $Therapeutic Exercise: 23-37 mins                    G Codes:       Darliss Cheney  Earney Navy, PTA Pager: [phone]   03/02/2016, 2:40 PM

## 2016-03-02 NOTE — Progress Notes (Signed)
SPORTS MEDICINE AND JOINT REPLACEMENT  Lara Mulch, MD    Carlyon Shadow, PA-C [place], [place], [place]  [address]                             [phone]   PROGRESS NOTE  Subjective:  negative for Chest Pain  negative for Shortness of Breath  negative for Nausea/Vomiting   negative for Calf Pain  negative for Bowel Movement   Tolerating Diet: yes         Patient reports pain as 4 on 0-10 scale.    Objective: Vital signs in last 24 hours:   Patient Vitals for the past 24 hrs:  BP Temp Temp src Pulse Resp SpO2  03/02/16 0417 138/65 97.7 F (36.5 C) Oral 62 16 96 %  03/02/16 0016 (!) 133/59 97.5 F (36.4 C) Oral 62 16 97 %  03/01/16 1957 (!) 136/58 97.5 F (36.4 C) Oral (!) 58 16 97 %  03/01/16 1946 - - - - - 98 %  03/01/16 1500 (!) 160/94 97.1 F (36.2 C) Oral 64 16 97 %  03/01/16 1138 (!) 162/98 97 F (36.1 C) Oral (!) 59 16 97 %  03/01/16 1110 (!) 156/75 97.9 F (36.6 C) - (!) 57 14 97 %  03/01/16 1055 (!) 172/77 - - (!) 56 14 96 %  03/01/16 1040 (!) 182/77 - - (!) 54 15 96 %  03/01/16 1030 (!) 176/95 - - (!) 55 16 97 %  03/01/16 1015 (!) 177/75 - - (!) 53 14 96 %  03/01/16 1000 (!) 176/74 - - (!) 52 16 96 %  03/01/16 0945 (!) 171/73 - - (!) 52 15 97 %  03/01/16 0930 - - - (!) 56 18 100 %  03/01/16 0929 (!) 143/89 97 F (36.1 C) - (!) 54 13 99 %    @flow {1959:LAST@   Intake/Output from previous day:   02/19 0701 - 02/20 0700 In: 2741.3 [P.O.:460; I.V.:2081.3] Out: 2250 [Urine:2200]   Intake/Output this shift:   No intake/output data recorded.   Intake/Output      02/19 0701 - 02/20 0700 02/20 0701 - 02/21 0700   P.O. 460    I.V. (mL/kg) 2081.3 (23.7)    Other 200    Total Intake(mL/kg) 2741.3 (31.2)    Urine (mL/kg/hr) 2200 (1)    Blood 50 (0)    Total Output 2250     Net +491.3             LABORATORY DATA:  Recent Labs  03/02/16 0606  WBC 18.3*  HGB 10.5*  HCT 31.9*  PLT 195    Recent Labs  03/02/16 0606  NA 139  K  4.1  CL 104  CO2 26  BUN 14  CREATININE 0.97  GLUCOSE 120*  CALCIUM 9.1   Lab Results  Component Value Date   INR 0.96 10/12/2011    Examination:  General appearance: alert, cooperative and no distress Extremities: extremities normal, atraumatic, no cyanosis or edema  Wound Exam: clean, dry, intact   Drainage:  None: wound tissue dry  Motor Exam: Quadriceps and Hamstrings Intact  Sensory Exam: Superficial Peroneal, Deep Peroneal and Tibial normal   Assessment:    1 Day Post-Op  Procedure(s) (LRB): RIGHT TOTAL KNEE ARTHROPLASTY (Right)  ADDITIONAL DIAGNOSIS:  Active Problems:   S/P total knee replacement     Plan: Physical Therapy as ordered Weight Bearing as Tolerated (WBAT)  DVT Prophylaxis:  Aspirin  DISCHARGE PLAN: Home  DISCHARGE NEEDS: HHPT   Patient doing great, anticipate D/C home today         Donia Ast 03/02/2016, 7:07 AM

## 2016-03-03 DIAGNOSIS — Z471 Aftercare following joint replacement surgery: Secondary | ICD-10-CM | POA: Diagnosis not present

## 2016-03-03 DIAGNOSIS — I10 Essential (primary) hypertension: Secondary | ICD-10-CM | POA: Diagnosis not present

## 2016-03-03 DIAGNOSIS — H903 Sensorineural hearing loss, bilateral: Secondary | ICD-10-CM | POA: Diagnosis not present

## 2016-03-03 DIAGNOSIS — J4530 Mild persistent asthma, uncomplicated: Secondary | ICD-10-CM | POA: Diagnosis not present

## 2016-03-03 DIAGNOSIS — G603 Idiopathic progressive neuropathy: Secondary | ICD-10-CM | POA: Diagnosis not present

## 2016-03-03 DIAGNOSIS — M1990 Unspecified osteoarthritis, unspecified site: Secondary | ICD-10-CM | POA: Diagnosis not present

## 2016-03-04 DIAGNOSIS — M1990 Unspecified osteoarthritis, unspecified site: Secondary | ICD-10-CM | POA: Diagnosis not present

## 2016-03-04 DIAGNOSIS — I10 Essential (primary) hypertension: Secondary | ICD-10-CM | POA: Diagnosis not present

## 2016-03-04 DIAGNOSIS — G603 Idiopathic progressive neuropathy: Secondary | ICD-10-CM | POA: Diagnosis not present

## 2016-03-04 DIAGNOSIS — H903 Sensorineural hearing loss, bilateral: Secondary | ICD-10-CM | POA: Diagnosis not present

## 2016-03-04 DIAGNOSIS — Z471 Aftercare following joint replacement surgery: Secondary | ICD-10-CM | POA: Diagnosis not present

## 2016-03-04 DIAGNOSIS — J4530 Mild persistent asthma, uncomplicated: Secondary | ICD-10-CM | POA: Diagnosis not present

## 2016-03-05 DIAGNOSIS — Z471 Aftercare following joint replacement surgery: Secondary | ICD-10-CM | POA: Diagnosis not present

## 2016-03-05 DIAGNOSIS — H903 Sensorineural hearing loss, bilateral: Secondary | ICD-10-CM | POA: Diagnosis not present

## 2016-03-05 DIAGNOSIS — M1990 Unspecified osteoarthritis, unspecified site: Secondary | ICD-10-CM | POA: Diagnosis not present

## 2016-03-05 DIAGNOSIS — I10 Essential (primary) hypertension: Secondary | ICD-10-CM | POA: Diagnosis not present

## 2016-03-05 DIAGNOSIS — G603 Idiopathic progressive neuropathy: Secondary | ICD-10-CM | POA: Diagnosis not present

## 2016-03-05 DIAGNOSIS — J4530 Mild persistent asthma, uncomplicated: Secondary | ICD-10-CM | POA: Diagnosis not present

## 2016-03-08 DIAGNOSIS — Z471 Aftercare following joint replacement surgery: Secondary | ICD-10-CM | POA: Diagnosis not present

## 2016-03-08 DIAGNOSIS — J4530 Mild persistent asthma, uncomplicated: Secondary | ICD-10-CM | POA: Diagnosis not present

## 2016-03-08 DIAGNOSIS — H903 Sensorineural hearing loss, bilateral: Secondary | ICD-10-CM | POA: Diagnosis not present

## 2016-03-08 DIAGNOSIS — I10 Essential (primary) hypertension: Secondary | ICD-10-CM | POA: Diagnosis not present

## 2016-03-08 DIAGNOSIS — M1990 Unspecified osteoarthritis, unspecified site: Secondary | ICD-10-CM | POA: Diagnosis not present

## 2016-03-08 DIAGNOSIS — G603 Idiopathic progressive neuropathy: Secondary | ICD-10-CM | POA: Diagnosis not present

## 2016-03-09 DIAGNOSIS — N3941 Urge incontinence: Secondary | ICD-10-CM | POA: Diagnosis not present

## 2016-03-09 DIAGNOSIS — R3915 Urgency of urination: Secondary | ICD-10-CM | POA: Diagnosis not present

## 2016-03-09 DIAGNOSIS — N390 Urinary tract infection, site not specified: Secondary | ICD-10-CM | POA: Diagnosis not present

## 2016-03-09 DIAGNOSIS — R350 Frequency of micturition: Secondary | ICD-10-CM | POA: Diagnosis not present

## 2016-03-09 DIAGNOSIS — N3281 Overactive bladder: Secondary | ICD-10-CM | POA: Diagnosis not present

## 2016-03-10 DIAGNOSIS — H903 Sensorineural hearing loss, bilateral: Secondary | ICD-10-CM | POA: Diagnosis not present

## 2016-03-10 DIAGNOSIS — Z471 Aftercare following joint replacement surgery: Secondary | ICD-10-CM | POA: Diagnosis not present

## 2016-03-10 DIAGNOSIS — I10 Essential (primary) hypertension: Secondary | ICD-10-CM | POA: Diagnosis not present

## 2016-03-10 DIAGNOSIS — G603 Idiopathic progressive neuropathy: Secondary | ICD-10-CM | POA: Diagnosis not present

## 2016-03-10 DIAGNOSIS — M1990 Unspecified osteoarthritis, unspecified site: Secondary | ICD-10-CM | POA: Diagnosis not present

## 2016-03-10 DIAGNOSIS — J4530 Mild persistent asthma, uncomplicated: Secondary | ICD-10-CM | POA: Diagnosis not present

## 2016-03-11 DIAGNOSIS — Z471 Aftercare following joint replacement surgery: Secondary | ICD-10-CM | POA: Diagnosis not present

## 2016-03-11 DIAGNOSIS — Z96651 Presence of right artificial knee joint: Secondary | ICD-10-CM | POA: Diagnosis not present

## 2016-03-12 DIAGNOSIS — M25561 Pain in right knee: Secondary | ICD-10-CM | POA: Diagnosis not present

## 2016-03-12 DIAGNOSIS — R2689 Other abnormalities of gait and mobility: Secondary | ICD-10-CM | POA: Diagnosis not present

## 2016-03-12 DIAGNOSIS — M25661 Stiffness of right knee, not elsewhere classified: Secondary | ICD-10-CM | POA: Diagnosis not present

## 2016-03-19 DIAGNOSIS — R2689 Other abnormalities of gait and mobility: Secondary | ICD-10-CM | POA: Diagnosis not present

## 2016-03-19 DIAGNOSIS — M25661 Stiffness of right knee, not elsewhere classified: Secondary | ICD-10-CM | POA: Diagnosis not present

## 2016-03-19 DIAGNOSIS — M25561 Pain in right knee: Secondary | ICD-10-CM | POA: Diagnosis not present

## 2016-03-25 DIAGNOSIS — M25561 Pain in right knee: Secondary | ICD-10-CM | POA: Diagnosis not present

## 2016-03-25 DIAGNOSIS — R2689 Other abnormalities of gait and mobility: Secondary | ICD-10-CM | POA: Diagnosis not present

## 2016-03-25 DIAGNOSIS — M25661 Stiffness of right knee, not elsewhere classified: Secondary | ICD-10-CM | POA: Diagnosis not present

## 2016-03-29 DIAGNOSIS — E042 Nontoxic multinodular goiter: Secondary | ICD-10-CM | POA: Diagnosis not present

## 2016-03-29 DIAGNOSIS — Z6828 Body mass index (BMI) 28.0-28.9, adult: Secondary | ICD-10-CM | POA: Diagnosis not present

## 2016-03-29 DIAGNOSIS — R531 Weakness: Secondary | ICD-10-CM | POA: Diagnosis not present

## 2016-03-29 DIAGNOSIS — I959 Hypotension, unspecified: Secondary | ICD-10-CM | POA: Diagnosis not present

## 2016-03-30 DIAGNOSIS — N390 Urinary tract infection, site not specified: Secondary | ICD-10-CM | POA: Diagnosis not present

## 2016-03-30 DIAGNOSIS — R350 Frequency of micturition: Secondary | ICD-10-CM | POA: Diagnosis not present

## 2016-04-06 DIAGNOSIS — R2689 Other abnormalities of gait and mobility: Secondary | ICD-10-CM | POA: Diagnosis not present

## 2016-04-06 DIAGNOSIS — M25661 Stiffness of right knee, not elsewhere classified: Secondary | ICD-10-CM | POA: Diagnosis not present

## 2016-04-06 DIAGNOSIS — M25561 Pain in right knee: Secondary | ICD-10-CM | POA: Diagnosis not present

## 2016-04-08 DIAGNOSIS — M25661 Stiffness of right knee, not elsewhere classified: Secondary | ICD-10-CM | POA: Diagnosis not present

## 2016-04-08 DIAGNOSIS — M25561 Pain in right knee: Secondary | ICD-10-CM | POA: Diagnosis not present

## 2016-04-08 DIAGNOSIS — R2689 Other abnormalities of gait and mobility: Secondary | ICD-10-CM | POA: Diagnosis not present

## 2016-04-12 DIAGNOSIS — Z1389 Encounter for screening for other disorder: Secondary | ICD-10-CM | POA: Diagnosis not present

## 2016-04-12 DIAGNOSIS — Z1231 Encounter for screening mammogram for malignant neoplasm of breast: Secondary | ICD-10-CM | POA: Diagnosis not present

## 2016-04-12 DIAGNOSIS — Z0000 Encounter for general adult medical examination without abnormal findings: Secondary | ICD-10-CM | POA: Diagnosis not present

## 2016-04-12 DIAGNOSIS — K219 Gastro-esophageal reflux disease without esophagitis: Secondary | ICD-10-CM | POA: Diagnosis not present

## 2016-04-12 DIAGNOSIS — Z9181 History of falling: Secondary | ICD-10-CM | POA: Diagnosis not present

## 2016-04-12 DIAGNOSIS — E785 Hyperlipidemia, unspecified: Secondary | ICD-10-CM | POA: Diagnosis not present

## 2016-04-12 DIAGNOSIS — Z136 Encounter for screening for cardiovascular disorders: Secondary | ICD-10-CM | POA: Diagnosis not present

## 2016-04-13 DIAGNOSIS — R2689 Other abnormalities of gait and mobility: Secondary | ICD-10-CM | POA: Diagnosis not present

## 2016-04-13 DIAGNOSIS — M25561 Pain in right knee: Secondary | ICD-10-CM | POA: Diagnosis not present

## 2016-04-13 DIAGNOSIS — M25661 Stiffness of right knee, not elsewhere classified: Secondary | ICD-10-CM | POA: Diagnosis not present

## 2016-04-15 DIAGNOSIS — M25561 Pain in right knee: Secondary | ICD-10-CM | POA: Diagnosis not present

## 2016-04-15 DIAGNOSIS — R2689 Other abnormalities of gait and mobility: Secondary | ICD-10-CM | POA: Diagnosis not present

## 2016-04-15 DIAGNOSIS — M25661 Stiffness of right knee, not elsewhere classified: Secondary | ICD-10-CM | POA: Diagnosis not present

## 2016-04-20 DIAGNOSIS — R3915 Urgency of urination: Secondary | ICD-10-CM | POA: Diagnosis not present

## 2016-04-20 DIAGNOSIS — R2689 Other abnormalities of gait and mobility: Secondary | ICD-10-CM | POA: Diagnosis not present

## 2016-04-20 DIAGNOSIS — R339 Retention of urine, unspecified: Secondary | ICD-10-CM | POA: Diagnosis not present

## 2016-04-20 DIAGNOSIS — M25661 Stiffness of right knee, not elsewhere classified: Secondary | ICD-10-CM | POA: Diagnosis not present

## 2016-04-20 DIAGNOSIS — N3941 Urge incontinence: Secondary | ICD-10-CM | POA: Diagnosis not present

## 2016-04-20 DIAGNOSIS — N3281 Overactive bladder: Secondary | ICD-10-CM | POA: Diagnosis not present

## 2016-04-20 DIAGNOSIS — M25561 Pain in right knee: Secondary | ICD-10-CM | POA: Diagnosis not present

## 2016-04-22 DIAGNOSIS — J019 Acute sinusitis, unspecified: Secondary | ICD-10-CM | POA: Diagnosis not present

## 2016-04-22 DIAGNOSIS — R2689 Other abnormalities of gait and mobility: Secondary | ICD-10-CM | POA: Diagnosis not present

## 2016-04-22 DIAGNOSIS — M25661 Stiffness of right knee, not elsewhere classified: Secondary | ICD-10-CM | POA: Diagnosis not present

## 2016-04-22 DIAGNOSIS — Z6828 Body mass index (BMI) 28.0-28.9, adult: Secondary | ICD-10-CM | POA: Diagnosis not present

## 2016-04-22 DIAGNOSIS — G250 Essential tremor: Secondary | ICD-10-CM | POA: Diagnosis not present

## 2016-04-22 DIAGNOSIS — M25561 Pain in right knee: Secondary | ICD-10-CM | POA: Diagnosis not present

## 2016-04-22 DIAGNOSIS — R600 Localized edema: Secondary | ICD-10-CM | POA: Diagnosis not present

## 2016-04-27 DIAGNOSIS — M25661 Stiffness of right knee, not elsewhere classified: Secondary | ICD-10-CM | POA: Diagnosis not present

## 2016-04-27 DIAGNOSIS — R2689 Other abnormalities of gait and mobility: Secondary | ICD-10-CM | POA: Diagnosis not present

## 2016-04-27 DIAGNOSIS — M25561 Pain in right knee: Secondary | ICD-10-CM | POA: Diagnosis not present

## 2016-04-28 DIAGNOSIS — Q5210 Doubling of vagina, unspecified: Secondary | ICD-10-CM | POA: Diagnosis not present

## 2016-04-28 DIAGNOSIS — Z01411 Encounter for gynecological examination (general) (routine) with abnormal findings: Secondary | ICD-10-CM | POA: Diagnosis not present

## 2016-04-28 DIAGNOSIS — N905 Atrophy of vulva: Secondary | ICD-10-CM | POA: Diagnosis not present

## 2016-04-28 DIAGNOSIS — N952 Postmenopausal atrophic vaginitis: Secondary | ICD-10-CM | POA: Diagnosis not present

## 2016-04-30 DIAGNOSIS — M25661 Stiffness of right knee, not elsewhere classified: Secondary | ICD-10-CM | POA: Diagnosis not present

## 2016-04-30 DIAGNOSIS — M25561 Pain in right knee: Secondary | ICD-10-CM | POA: Diagnosis not present

## 2016-04-30 DIAGNOSIS — R2689 Other abnormalities of gait and mobility: Secondary | ICD-10-CM | POA: Diagnosis not present

## 2016-05-03 ENCOUNTER — Ambulatory Visit (INDEPENDENT_AMBULATORY_CARE_PROVIDER_SITE_OTHER): Payer: Medicare Other

## 2016-05-03 ENCOUNTER — Ambulatory Visit (INDEPENDENT_AMBULATORY_CARE_PROVIDER_SITE_OTHER): Payer: Medicare Other | Admitting: Podiatry

## 2016-05-03 ENCOUNTER — Encounter: Payer: Self-pay | Admitting: Podiatry

## 2016-05-03 DIAGNOSIS — M729 Fibroblastic disorder, unspecified: Secondary | ICD-10-CM

## 2016-05-03 DIAGNOSIS — M2042 Other hammer toe(s) (acquired), left foot: Secondary | ICD-10-CM

## 2016-05-03 DIAGNOSIS — L603 Nail dystrophy: Secondary | ICD-10-CM

## 2016-05-03 DIAGNOSIS — M79672 Pain in left foot: Secondary | ICD-10-CM | POA: Diagnosis not present

## 2016-05-03 DIAGNOSIS — M2041 Other hammer toe(s) (acquired), right foot: Secondary | ICD-10-CM | POA: Diagnosis not present

## 2016-05-03 DIAGNOSIS — B351 Tinea unguium: Secondary | ICD-10-CM | POA: Diagnosis not present

## 2016-05-03 DIAGNOSIS — G629 Polyneuropathy, unspecified: Secondary | ICD-10-CM

## 2016-05-03 DIAGNOSIS — L608 Other nail disorders: Secondary | ICD-10-CM

## 2016-05-03 DIAGNOSIS — M79609 Pain in unspecified limb: Secondary | ICD-10-CM | POA: Diagnosis not present

## 2016-05-03 DIAGNOSIS — M79671 Pain in right foot: Secondary | ICD-10-CM

## 2016-05-03 MED ORDER — NONFORMULARY OR COMPOUNDED ITEM: 0 refills | Status: AC

## 2016-05-05 NOTE — Progress Notes (Signed)
   HPI: Patient is a 76 year old female presenting today for intermittent pain of the bilateral plantar forefoot and bilateral hammertoes that is been ongoing for the past year. She has a history of nondiabetic neuropathy. She expresses concern that the pain could be coming from wearing compression stockings. She is here for further evaluation and treatment.    Physical Exam: Physical Exam General: The patient is alert and oriented x3 in no acute distress.  Dermatology: Hyperkeratotic, discolored, thickened, onychodystrophy of nails noted bilaterally. Skin is cool, dry and supple bilateral lower extremities. Negative for open lesions or macerations.  Vascular: Palpable pedal pulses bilaterally. No edema or erythema noted. Capillary refill within normal limits.  Neurological: Epicritic and protective threshold grossly intact bilaterally.   Musculoskeletal Exam: All pedal and ankle joints range of motion within normal limits bilateral. Muscle strength 5/5 in all groups bilateral. Hammertoe contracture deformity noted to digits 2-5 of the bilteral feet.  Radiographic Exam: Hammertoe contracture deformity noted to the interphalangeal joints and MPJ of the respective hammertoe digits mentioned on clinical musculoskeletal exam.      Assessment: 1. Nondiabetic neuropathy 2. Hammertoes 2-5 bilaterally 3. History of bilateral knee replacement 4. Onychomycosis bilaterally   Plan of Care:  1. Patient was evaluated. 2. Prescription for neuropathy pain cream to be dispensed from [facility] 3. Discussed conservative versus surgical management of hammertoes. Patient opts for conservative management. 4. Recommend compression stockings that covered the toes. 5. Mechanical debridement of nails 1-5 bilaterally performed using a nail nipper. Filed with dremel without incident.  6. Return to clinic in 4 weeks.  Edrick Kins, DPM [facility]  Dr. Edrick Kins, [place]                                        [place], [place] [address]                Office [phone]  Fax [phone]

## 2016-05-11 DIAGNOSIS — N3281 Overactive bladder: Secondary | ICD-10-CM | POA: Diagnosis not present

## 2016-05-11 DIAGNOSIS — R3915 Urgency of urination: Secondary | ICD-10-CM | POA: Diagnosis not present

## 2016-05-11 DIAGNOSIS — N3941 Urge incontinence: Secondary | ICD-10-CM | POA: Diagnosis not present

## 2016-05-11 DIAGNOSIS — R339 Retention of urine, unspecified: Secondary | ICD-10-CM | POA: Diagnosis not present

## 2016-05-19 DIAGNOSIS — J4530 Mild persistent asthma, uncomplicated: Secondary | ICD-10-CM | POA: Diagnosis not present

## 2016-05-19 DIAGNOSIS — Z6828 Body mass index (BMI) 28.0-28.9, adult: Secondary | ICD-10-CM | POA: Diagnosis not present

## 2016-05-19 DIAGNOSIS — I10 Essential (primary) hypertension: Secondary | ICD-10-CM | POA: Diagnosis not present

## 2016-05-19 DIAGNOSIS — E785 Hyperlipidemia, unspecified: Secondary | ICD-10-CM | POA: Diagnosis not present

## 2016-05-19 DIAGNOSIS — E042 Nontoxic multinodular goiter: Secondary | ICD-10-CM | POA: Diagnosis not present

## 2016-05-19 DIAGNOSIS — Z23 Encounter for immunization: Secondary | ICD-10-CM | POA: Diagnosis not present

## 2016-05-19 DIAGNOSIS — R600 Localized edema: Secondary | ICD-10-CM | POA: Diagnosis not present

## 2016-05-31 ENCOUNTER — Ambulatory Visit (INDEPENDENT_AMBULATORY_CARE_PROVIDER_SITE_OTHER): Payer: Medicare Other | Admitting: Podiatry

## 2016-05-31 DIAGNOSIS — M79676 Pain in unspecified toe(s): Secondary | ICD-10-CM

## 2016-05-31 DIAGNOSIS — L600 Ingrowing nail: Secondary | ICD-10-CM

## 2016-05-31 DIAGNOSIS — B351 Tinea unguium: Secondary | ICD-10-CM

## 2016-05-31 DIAGNOSIS — G6289 Other specified polyneuropathies: Secondary | ICD-10-CM

## 2016-05-31 NOTE — Progress Notes (Signed)
   Subjective: Patient presents today for evaluation of pain in the medial border of the right great toe. Patient is concerned for possible ingrown nail. Patient states that the pain has been present for a few weeks now. She also complains of elongated, thickened nails. Pain while ambulating in shoes. Patient is unable to trim their own nails. Patient presents today for further treatment and evaluation.  Objective:  General: Well developed, nourished, in no acute distress, alert and oriented x3   Dermatology: Skin is warm, dry and supple bilateral. Medial border of right great toe appears to be erythematous with evidence of an ingrowing nail. Pain on palpation noted to the border of the nail fold. Nails are tender, long, thickened and dystrophic with subungual debris, consistent with onychomycosis, 1-5 bilateral. No signs of infection noted. The remaining nails appear unremarkable at this time. There are no open sores, lesions.  Vascular: Dorsalis Pedis artery and Posterior Tibial artery pedal pulses palpable. No lower extremity edema noted.   Neruologic: Grossly intact via light touch bilateral.  Musculoskeletal: Muscular strength within normal limits in all groups bilateral. Normal range of motion noted to all pedal and ankle joints.   Assesement: #1 Paronychia with ingrowing nail medial border of the right great toe #2 Pain in toe #3 Incurvated nail #4 DM with neuropathy #5 Onychomycosis of nail due to dermatophyte bilateral  Plan of Care:  1. Patient evaluated.  2. Discussed treatment alternatives and plan of care. Explained nail avulsion procedure and post procedure course to patient. 3. Patient opted for permanent partial nail avulsion.  4. Prior to procedure, local anesthesia infiltration utilized using 3 ml of a 50:50 mixture of 2% plain lidocaine and 0.5% plain marcaine in a normal hallux block fashion and a betadine prep performed.  5. Partial permanent nail avulsion with  chemical matrixectomy performed using [id number] applications of phenol followed by alcohol flush.  6. Light dressing applied. 7. Instructed to maintain good pedal hygiene and foot care. Stressed importance of controlling blood sugar.  8. Mechanical debridement of nails 1-5 bilaterally performed using a nail nipper. Filed with dremel without incident.  9. Continue using topical neuropathy pain cream from [employer]. 10. Return to clinic in 2 weeks.   Edrick Kins, DPM [facility]  Dr. Edrick Kins, [place]                                        [place], [place] [address]                Office [phone]  Fax [phone]

## 2016-06-16 ENCOUNTER — Ambulatory Visit (INDEPENDENT_AMBULATORY_CARE_PROVIDER_SITE_OTHER): Payer: Medicare Other | Admitting: Podiatry

## 2016-06-16 DIAGNOSIS — S91109D Unspecified open wound of unspecified toe(s) without damage to nail, subsequent encounter: Secondary | ICD-10-CM

## 2016-06-16 DIAGNOSIS — M79676 Pain in unspecified toe(s): Secondary | ICD-10-CM | POA: Diagnosis not present

## 2016-06-16 DIAGNOSIS — S91209D Unspecified open wound of unspecified toe(s) with damage to nail, subsequent encounter: Secondary | ICD-10-CM

## 2016-06-17 DIAGNOSIS — Z96651 Presence of right artificial knee joint: Secondary | ICD-10-CM | POA: Diagnosis not present

## 2016-06-17 DIAGNOSIS — Z471 Aftercare following joint replacement surgery: Secondary | ICD-10-CM | POA: Diagnosis not present

## 2016-06-19 NOTE — Progress Notes (Signed)
   Subjective: Patient presents today 2 weeks post ingrown nail permanent nail avulsion procedure of the medial border of the right great toe. Patient states that the toe and nail fold is feeling much better. She also has a new complaint of pain to the plantar aspect of the right heel. She states the pain recently started.  Objective: Skin is warm, dry and supple. Nail and respective nail fold appears to be healing appropriately. Open wound to the associated nail fold with a granular wound base and moderate amount of fibrotic tissue. Minimal drainage noted. Mild erythema around the periungual region likely due to phenol chemical matricectomy.  Assessment: #1 postop permanent partial nail avulsion medial border of right great toe #2 open wound periungual nail fold of respective digit.   Plan of care: #1 patient was evaluated. Patient declined x-rays. #2 debridement of open wound was performed to the periungual border of the respective toe using a currette. Antibiotic ointment and Band-Aid was applied. #3 patient is to return to clinic on a PRN  basis.   Edrick Kins, DPM [facility]  Dr. Edrick Kins, [place]                                        [place], [place] [address]                Office [phone]  Fax [phone]

## 2016-06-21 DIAGNOSIS — Z1231 Encounter for screening mammogram for malignant neoplasm of breast: Secondary | ICD-10-CM | POA: Diagnosis not present

## 2016-08-25 DIAGNOSIS — Z6829 Body mass index (BMI) 29.0-29.9, adult: Secondary | ICD-10-CM | POA: Diagnosis not present

## 2016-08-25 DIAGNOSIS — I10 Essential (primary) hypertension: Secondary | ICD-10-CM | POA: Diagnosis not present

## 2016-08-25 DIAGNOSIS — J4530 Mild persistent asthma, uncomplicated: Secondary | ICD-10-CM | POA: Diagnosis not present

## 2016-08-25 DIAGNOSIS — E785 Hyperlipidemia, unspecified: Secondary | ICD-10-CM | POA: Diagnosis not present

## 2016-08-25 DIAGNOSIS — R600 Localized edema: Secondary | ICD-10-CM | POA: Diagnosis not present

## 2016-08-25 DIAGNOSIS — E042 Nontoxic multinodular goiter: Secondary | ICD-10-CM | POA: Diagnosis not present

## 2016-09-21 DIAGNOSIS — J4530 Mild persistent asthma, uncomplicated: Secondary | ICD-10-CM | POA: Diagnosis not present

## 2016-09-21 DIAGNOSIS — J309 Allergic rhinitis, unspecified: Secondary | ICD-10-CM | POA: Diagnosis not present

## 2016-09-23 DIAGNOSIS — Z96651 Presence of right artificial knee joint: Secondary | ICD-10-CM | POA: Diagnosis not present

## 2016-09-23 DIAGNOSIS — Z471 Aftercare following joint replacement surgery: Secondary | ICD-10-CM | POA: Diagnosis not present

## 2016-10-11 DIAGNOSIS — J019 Acute sinusitis, unspecified: Secondary | ICD-10-CM | POA: Diagnosis not present

## 2016-10-11 DIAGNOSIS — Z6828 Body mass index (BMI) 28.0-28.9, adult: Secondary | ICD-10-CM | POA: Diagnosis not present

## 2016-11-09 DIAGNOSIS — S0990XA Unspecified injury of head, initial encounter: Secondary | ICD-10-CM | POA: Diagnosis not present

## 2016-11-09 DIAGNOSIS — S81012A Laceration without foreign body, left knee, initial encounter: Secondary | ICD-10-CM | POA: Diagnosis not present

## 2016-11-09 DIAGNOSIS — S8992XA Unspecified injury of left lower leg, initial encounter: Secondary | ICD-10-CM | POA: Diagnosis not present

## 2016-11-09 DIAGNOSIS — R51 Headache: Secondary | ICD-10-CM | POA: Diagnosis not present

## 2016-11-09 DIAGNOSIS — M25462 Effusion, left knee: Secondary | ICD-10-CM | POA: Diagnosis not present

## 2016-11-24 DIAGNOSIS — Z23 Encounter for immunization: Secondary | ICD-10-CM | POA: Diagnosis not present

## 2016-11-24 DIAGNOSIS — E785 Hyperlipidemia, unspecified: Secondary | ICD-10-CM | POA: Diagnosis not present

## 2016-11-24 DIAGNOSIS — E042 Nontoxic multinodular goiter: Secondary | ICD-10-CM | POA: Diagnosis not present

## 2016-11-24 DIAGNOSIS — I10 Essential (primary) hypertension: Secondary | ICD-10-CM | POA: Diagnosis not present

## 2016-11-24 DIAGNOSIS — J019 Acute sinusitis, unspecified: Secondary | ICD-10-CM | POA: Diagnosis not present

## 2016-11-24 DIAGNOSIS — J4530 Mild persistent asthma, uncomplicated: Secondary | ICD-10-CM | POA: Diagnosis not present

## 2016-11-24 DIAGNOSIS — Z6828 Body mass index (BMI) 28.0-28.9, adult: Secondary | ICD-10-CM | POA: Diagnosis not present

## 2016-11-24 DIAGNOSIS — R600 Localized edema: Secondary | ICD-10-CM | POA: Diagnosis not present

## 2017-02-23 DIAGNOSIS — J019 Acute sinusitis, unspecified: Secondary | ICD-10-CM | POA: Diagnosis not present

## 2017-02-23 DIAGNOSIS — J208 Acute bronchitis due to other specified organisms: Secondary | ICD-10-CM | POA: Diagnosis not present

## 2017-02-23 DIAGNOSIS — Z6828 Body mass index (BMI) 28.0-28.9, adult: Secondary | ICD-10-CM | POA: Diagnosis not present

## 2017-03-02 DIAGNOSIS — R600 Localized edema: Secondary | ICD-10-CM | POA: Diagnosis not present

## 2017-03-02 DIAGNOSIS — Z6827 Body mass index (BMI) 27.0-27.9, adult: Secondary | ICD-10-CM | POA: Diagnosis not present

## 2017-03-02 DIAGNOSIS — E785 Hyperlipidemia, unspecified: Secondary | ICD-10-CM | POA: Diagnosis not present

## 2017-03-02 DIAGNOSIS — J4530 Mild persistent asthma, uncomplicated: Secondary | ICD-10-CM | POA: Diagnosis not present

## 2017-03-02 DIAGNOSIS — I10 Essential (primary) hypertension: Secondary | ICD-10-CM | POA: Diagnosis not present

## 2017-03-02 DIAGNOSIS — E042 Nontoxic multinodular goiter: Secondary | ICD-10-CM | POA: Diagnosis not present

## 2017-03-04 DIAGNOSIS — L309 Dermatitis, unspecified: Secondary | ICD-10-CM | POA: Diagnosis not present

## 2017-05-04 DIAGNOSIS — N905 Atrophy of vulva: Secondary | ICD-10-CM | POA: Diagnosis not present

## 2017-05-04 DIAGNOSIS — N952 Postmenopausal atrophic vaginitis: Secondary | ICD-10-CM | POA: Diagnosis not present

## 2017-05-04 DIAGNOSIS — N951 Menopausal and female climacteric states: Secondary | ICD-10-CM | POA: Diagnosis not present

## 2017-05-04 DIAGNOSIS — Q5210 Doubling of vagina, unspecified: Secondary | ICD-10-CM | POA: Diagnosis not present

## 2017-05-04 DIAGNOSIS — Z01419 Encounter for gynecological examination (general) (routine) without abnormal findings: Secondary | ICD-10-CM | POA: Diagnosis not present

## 2017-05-04 DIAGNOSIS — R102 Pelvic and perineal pain: Secondary | ICD-10-CM | POA: Diagnosis not present

## 2017-05-24 DIAGNOSIS — J4530 Mild persistent asthma, uncomplicated: Secondary | ICD-10-CM | POA: Diagnosis not present

## 2017-06-01 DIAGNOSIS — E042 Nontoxic multinodular goiter: Secondary | ICD-10-CM | POA: Diagnosis not present

## 2017-06-01 DIAGNOSIS — I10 Essential (primary) hypertension: Secondary | ICD-10-CM | POA: Diagnosis not present

## 2017-06-01 DIAGNOSIS — E785 Hyperlipidemia, unspecified: Secondary | ICD-10-CM | POA: Diagnosis not present

## 2017-06-01 DIAGNOSIS — R600 Localized edema: Secondary | ICD-10-CM | POA: Diagnosis not present

## 2017-06-27 DIAGNOSIS — N3941 Urge incontinence: Secondary | ICD-10-CM | POA: Diagnosis not present

## 2017-06-28 DIAGNOSIS — M8589 Other specified disorders of bone density and structure, multiple sites: Secondary | ICD-10-CM | POA: Diagnosis not present

## 2017-06-28 DIAGNOSIS — Z1231 Encounter for screening mammogram for malignant neoplasm of breast: Secondary | ICD-10-CM | POA: Diagnosis not present

## 2017-06-28 DIAGNOSIS — M85852 Other specified disorders of bone density and structure, left thigh: Secondary | ICD-10-CM | POA: Diagnosis not present

## 2017-06-28 DIAGNOSIS — Z780 Asymptomatic menopausal state: Secondary | ICD-10-CM | POA: Diagnosis not present

## 2017-07-05 DIAGNOSIS — N3941 Urge incontinence: Secondary | ICD-10-CM | POA: Diagnosis not present

## 2017-07-19 DIAGNOSIS — N3941 Urge incontinence: Secondary | ICD-10-CM | POA: Diagnosis not present

## 2017-08-10 ENCOUNTER — Other Ambulatory Visit: Payer: Self-pay

## 2017-08-23 DIAGNOSIS — L728 Other follicular cysts of the skin and subcutaneous tissue: Secondary | ICD-10-CM | POA: Diagnosis not present

## 2017-08-23 DIAGNOSIS — L570 Actinic keratosis: Secondary | ICD-10-CM | POA: Diagnosis not present

## 2017-08-23 DIAGNOSIS — D1801 Hemangioma of skin and subcutaneous tissue: Secondary | ICD-10-CM | POA: Diagnosis not present

## 2017-09-01 DIAGNOSIS — E785 Hyperlipidemia, unspecified: Secondary | ICD-10-CM | POA: Diagnosis not present

## 2017-09-01 DIAGNOSIS — E042 Nontoxic multinodular goiter: Secondary | ICD-10-CM | POA: Diagnosis not present

## 2017-09-14 DIAGNOSIS — E785 Hyperlipidemia, unspecified: Secondary | ICD-10-CM | POA: Diagnosis not present

## 2017-09-14 DIAGNOSIS — Z1331 Encounter for screening for depression: Secondary | ICD-10-CM | POA: Diagnosis not present

## 2017-09-14 DIAGNOSIS — Z0000 Encounter for general adult medical examination without abnormal findings: Secondary | ICD-10-CM | POA: Diagnosis not present

## 2017-09-14 DIAGNOSIS — Z9181 History of falling: Secondary | ICD-10-CM | POA: Diagnosis not present

## 2017-09-18 DIAGNOSIS — S0990XA Unspecified injury of head, initial encounter: Secondary | ICD-10-CM | POA: Diagnosis not present

## 2017-10-03 DIAGNOSIS — M2042 Other hammer toe(s) (acquired), left foot: Secondary | ICD-10-CM | POA: Diagnosis not present

## 2017-10-03 DIAGNOSIS — I7300 Raynaud's syndrome without gangrene: Secondary | ICD-10-CM | POA: Diagnosis not present

## 2017-10-03 DIAGNOSIS — M7742 Metatarsalgia, left foot: Secondary | ICD-10-CM | POA: Diagnosis not present

## 2017-10-03 DIAGNOSIS — M7741 Metatarsalgia, right foot: Secondary | ICD-10-CM | POA: Diagnosis not present

## 2017-10-03 DIAGNOSIS — M2041 Other hammer toe(s) (acquired), right foot: Secondary | ICD-10-CM | POA: Diagnosis not present

## 2017-10-10 DIAGNOSIS — L03115 Cellulitis of right lower limb: Secondary | ICD-10-CM | POA: Diagnosis not present

## 2017-10-10 DIAGNOSIS — S90861A Insect bite (nonvenomous), right foot, initial encounter: Secondary | ICD-10-CM | POA: Diagnosis not present

## 2017-10-13 DIAGNOSIS — I7300 Raynaud's syndrome without gangrene: Secondary | ICD-10-CM | POA: Diagnosis not present

## 2017-11-08 DIAGNOSIS — M2042 Other hammer toe(s) (acquired), left foot: Secondary | ICD-10-CM | POA: Diagnosis not present

## 2017-12-03 DIAGNOSIS — E785 Hyperlipidemia, unspecified: Secondary | ICD-10-CM | POA: Diagnosis not present

## 2017-12-03 DIAGNOSIS — Z23 Encounter for immunization: Secondary | ICD-10-CM | POA: Diagnosis not present

## 2017-12-03 DIAGNOSIS — S51011A Laceration without foreign body of right elbow, initial encounter: Secondary | ICD-10-CM | POA: Diagnosis not present

## 2017-12-03 DIAGNOSIS — M25511 Pain in right shoulder: Secondary | ICD-10-CM | POA: Diagnosis not present

## 2017-12-03 DIAGNOSIS — I10 Essential (primary) hypertension: Secondary | ICD-10-CM | POA: Diagnosis not present

## 2017-12-03 DIAGNOSIS — R600 Localized edema: Secondary | ICD-10-CM | POA: Diagnosis not present

## 2017-12-03 DIAGNOSIS — E042 Nontoxic multinodular goiter: Secondary | ICD-10-CM | POA: Diagnosis not present

## 2017-12-13 DIAGNOSIS — S51011A Laceration without foreign body of right elbow, initial encounter: Secondary | ICD-10-CM | POA: Diagnosis not present

## 2017-12-31 DIAGNOSIS — J029 Acute pharyngitis, unspecified: Secondary | ICD-10-CM | POA: Diagnosis not present

## 2017-12-31 DIAGNOSIS — J411 Mucopurulent chronic bronchitis: Secondary | ICD-10-CM | POA: Diagnosis not present

## 2017-12-31 DIAGNOSIS — R05 Cough: Secondary | ICD-10-CM | POA: Diagnosis not present

## 2018-02-14 DIAGNOSIS — N3941 Urge incontinence: Secondary | ICD-10-CM | POA: Diagnosis not present

## 2018-03-08 DIAGNOSIS — I10 Essential (primary) hypertension: Secondary | ICD-10-CM | POA: Diagnosis not present

## 2018-03-08 DIAGNOSIS — E785 Hyperlipidemia, unspecified: Secondary | ICD-10-CM | POA: Diagnosis not present

## 2018-03-08 DIAGNOSIS — J019 Acute sinusitis, unspecified: Secondary | ICD-10-CM | POA: Diagnosis not present

## 2018-03-08 DIAGNOSIS — E042 Nontoxic multinodular goiter: Secondary | ICD-10-CM | POA: Diagnosis not present

## 2018-03-08 DIAGNOSIS — R600 Localized edema: Secondary | ICD-10-CM | POA: Diagnosis not present

## 2018-05-25 DIAGNOSIS — J4530 Mild persistent asthma, uncomplicated: Secondary | ICD-10-CM | POA: Diagnosis not present

## 2018-06-07 DIAGNOSIS — K5900 Constipation, unspecified: Secondary | ICD-10-CM | POA: Diagnosis not present

## 2018-06-07 DIAGNOSIS — K2970 Gastritis, unspecified, without bleeding: Secondary | ICD-10-CM | POA: Diagnosis not present

## 2018-06-09 DIAGNOSIS — E785 Hyperlipidemia, unspecified: Secondary | ICD-10-CM | POA: Diagnosis not present

## 2018-06-09 DIAGNOSIS — I10 Essential (primary) hypertension: Secondary | ICD-10-CM | POA: Diagnosis not present

## 2018-06-09 DIAGNOSIS — R600 Localized edema: Secondary | ICD-10-CM | POA: Diagnosis not present

## 2018-06-09 DIAGNOSIS — E042 Nontoxic multinodular goiter: Secondary | ICD-10-CM | POA: Diagnosis not present

## 2018-06-13 DIAGNOSIS — D126 Benign neoplasm of colon, unspecified: Secondary | ICD-10-CM | POA: Diagnosis not present

## 2018-06-13 DIAGNOSIS — Z9049 Acquired absence of other specified parts of digestive tract: Secondary | ICD-10-CM | POA: Diagnosis not present

## 2018-06-13 DIAGNOSIS — D127 Benign neoplasm of rectosigmoid junction: Secondary | ICD-10-CM | POA: Diagnosis not present

## 2018-06-13 DIAGNOSIS — Z90710 Acquired absence of both cervix and uterus: Secondary | ICD-10-CM | POA: Diagnosis not present

## 2018-06-13 DIAGNOSIS — Z79899 Other long term (current) drug therapy: Secondary | ICD-10-CM | POA: Diagnosis not present

## 2018-06-13 DIAGNOSIS — J45909 Unspecified asthma, uncomplicated: Secondary | ICD-10-CM | POA: Diagnosis not present

## 2018-06-13 DIAGNOSIS — K635 Polyp of colon: Secondary | ICD-10-CM | POA: Diagnosis not present

## 2018-06-13 DIAGNOSIS — K644 Residual hemorrhoidal skin tags: Secondary | ICD-10-CM | POA: Diagnosis not present

## 2018-06-13 DIAGNOSIS — K621 Rectal polyp: Secondary | ICD-10-CM | POA: Diagnosis not present

## 2018-06-13 DIAGNOSIS — E079 Disorder of thyroid, unspecified: Secondary | ICD-10-CM | POA: Diagnosis not present

## 2018-06-13 DIAGNOSIS — Z7982 Long term (current) use of aspirin: Secondary | ICD-10-CM | POA: Diagnosis not present

## 2018-06-13 DIAGNOSIS — I10 Essential (primary) hypertension: Secondary | ICD-10-CM | POA: Diagnosis not present

## 2018-06-13 DIAGNOSIS — D124 Benign neoplasm of descending colon: Secondary | ICD-10-CM | POA: Diagnosis not present

## 2018-06-13 DIAGNOSIS — Z86010 Personal history of colonic polyps: Secondary | ICD-10-CM | POA: Diagnosis not present

## 2018-06-23 DIAGNOSIS — W19XXXA Unspecified fall, initial encounter: Secondary | ICD-10-CM | POA: Diagnosis not present

## 2018-06-23 DIAGNOSIS — S81011A Laceration without foreign body, right knee, initial encounter: Secondary | ICD-10-CM | POA: Diagnosis not present

## 2018-06-23 DIAGNOSIS — R0902 Hypoxemia: Secondary | ICD-10-CM | POA: Diagnosis not present

## 2018-06-23 DIAGNOSIS — Z96651 Presence of right artificial knee joint: Secondary | ICD-10-CM | POA: Diagnosis not present

## 2018-06-23 DIAGNOSIS — R52 Pain, unspecified: Secondary | ICD-10-CM | POA: Diagnosis not present

## 2018-06-24 DIAGNOSIS — S81811A Laceration without foreign body, right lower leg, initial encounter: Secondary | ICD-10-CM | POA: Diagnosis not present

## 2018-06-27 DIAGNOSIS — S81001A Unspecified open wound, right knee, initial encounter: Secondary | ICD-10-CM | POA: Diagnosis not present

## 2018-06-27 DIAGNOSIS — S81811S Laceration without foreign body, right lower leg, sequela: Secondary | ICD-10-CM | POA: Diagnosis not present

## 2018-06-30 DIAGNOSIS — Z4801 Encounter for change or removal of surgical wound dressing: Secondary | ICD-10-CM | POA: Diagnosis not present

## 2018-07-12 DIAGNOSIS — W010XXA Fall on same level from slipping, tripping and stumbling without subsequent striking against object, initial encounter: Secondary | ICD-10-CM | POA: Diagnosis not present

## 2018-07-12 DIAGNOSIS — Z23 Encounter for immunization: Secondary | ICD-10-CM | POA: Diagnosis not present

## 2018-07-12 DIAGNOSIS — M25522 Pain in left elbow: Secondary | ICD-10-CM | POA: Diagnosis not present

## 2018-07-12 DIAGNOSIS — S51012A Laceration without foreign body of left elbow, initial encounter: Secondary | ICD-10-CM | POA: Diagnosis not present

## 2018-07-12 DIAGNOSIS — Z1231 Encounter for screening mammogram for malignant neoplasm of breast: Secondary | ICD-10-CM | POA: Diagnosis not present

## 2018-09-06 DIAGNOSIS — Q5210 Doubling of vagina, unspecified: Secondary | ICD-10-CM | POA: Diagnosis not present

## 2018-09-06 DIAGNOSIS — N905 Atrophy of vulva: Secondary | ICD-10-CM | POA: Diagnosis not present

## 2018-09-06 DIAGNOSIS — N952 Postmenopausal atrophic vaginitis: Secondary | ICD-10-CM | POA: Diagnosis not present

## 2018-09-06 DIAGNOSIS — Z01419 Encounter for gynecological examination (general) (routine) without abnormal findings: Secondary | ICD-10-CM | POA: Diagnosis not present

## 2018-09-06 DIAGNOSIS — N951 Menopausal and female climacteric states: Secondary | ICD-10-CM | POA: Diagnosis not present

## 2018-09-06 DIAGNOSIS — Z90710 Acquired absence of both cervix and uterus: Secondary | ICD-10-CM | POA: Diagnosis not present

## 2018-09-11 DIAGNOSIS — E785 Hyperlipidemia, unspecified: Secondary | ICD-10-CM | POA: Diagnosis not present

## 2018-09-11 DIAGNOSIS — R600 Localized edema: Secondary | ICD-10-CM | POA: Diagnosis not present

## 2018-09-11 DIAGNOSIS — M545 Low back pain: Secondary | ICD-10-CM | POA: Diagnosis not present

## 2018-09-11 DIAGNOSIS — Z139 Encounter for screening, unspecified: Secondary | ICD-10-CM | POA: Diagnosis not present

## 2018-09-11 DIAGNOSIS — I10 Essential (primary) hypertension: Secondary | ICD-10-CM | POA: Diagnosis not present

## 2018-09-11 DIAGNOSIS — E042 Nontoxic multinodular goiter: Secondary | ICD-10-CM | POA: Diagnosis not present

## 2018-09-19 DIAGNOSIS — E785 Hyperlipidemia, unspecified: Secondary | ICD-10-CM | POA: Diagnosis not present

## 2018-09-19 DIAGNOSIS — Z0000 Encounter for general adult medical examination without abnormal findings: Secondary | ICD-10-CM | POA: Diagnosis not present

## 2018-09-19 DIAGNOSIS — Z9181 History of falling: Secondary | ICD-10-CM | POA: Diagnosis not present

## 2018-09-19 DIAGNOSIS — Z136 Encounter for screening for cardiovascular disorders: Secondary | ICD-10-CM | POA: Diagnosis not present

## 2018-09-19 DIAGNOSIS — Z1331 Encounter for screening for depression: Secondary | ICD-10-CM | POA: Diagnosis not present

## 2018-10-04 DIAGNOSIS — N3941 Urge incontinence: Secondary | ICD-10-CM | POA: Diagnosis not present

## 2018-10-11 DIAGNOSIS — E042 Nontoxic multinodular goiter: Secondary | ICD-10-CM | POA: Diagnosis not present

## 2018-10-18 DIAGNOSIS — N3941 Urge incontinence: Secondary | ICD-10-CM | POA: Diagnosis not present

## 2018-10-30 DIAGNOSIS — H5310 Unspecified subjective visual disturbances: Secondary | ICD-10-CM | POA: Diagnosis not present

## 2018-10-30 DIAGNOSIS — H16223 Keratoconjunctivitis sicca, not specified as Sjogren's, bilateral: Secondary | ICD-10-CM | POA: Diagnosis not present

## 2018-10-30 DIAGNOSIS — H524 Presbyopia: Secondary | ICD-10-CM | POA: Diagnosis not present

## 2018-11-15 DIAGNOSIS — N3941 Urge incontinence: Secondary | ICD-10-CM | POA: Diagnosis not present

## 2018-12-05 DIAGNOSIS — N3941 Urge incontinence: Secondary | ICD-10-CM | POA: Diagnosis not present

## 2018-12-11 DIAGNOSIS — H16223 Keratoconjunctivitis sicca, not specified as Sjogren's, bilateral: Secondary | ICD-10-CM | POA: Diagnosis not present

## 2018-12-11 DIAGNOSIS — H35433 Paving stone degeneration of retina, bilateral: Secondary | ICD-10-CM | POA: Diagnosis not present

## 2018-12-11 DIAGNOSIS — H35443 Age-related reticular degeneration of retina, bilateral: Secondary | ICD-10-CM | POA: Diagnosis not present

## 2018-12-11 DIAGNOSIS — H5310 Unspecified subjective visual disturbances: Secondary | ICD-10-CM | POA: Diagnosis not present

## 2018-12-11 DIAGNOSIS — H35373 Puckering of macula, bilateral: Secondary | ICD-10-CM | POA: Diagnosis not present

## 2018-12-11 DIAGNOSIS — Z961 Presence of intraocular lens: Secondary | ICD-10-CM | POA: Diagnosis not present

## 2018-12-11 DIAGNOSIS — H40053 Ocular hypertension, bilateral: Secondary | ICD-10-CM | POA: Diagnosis not present

## 2018-12-14 DIAGNOSIS — I10 Essential (primary) hypertension: Secondary | ICD-10-CM | POA: Diagnosis not present

## 2018-12-14 DIAGNOSIS — E785 Hyperlipidemia, unspecified: Secondary | ICD-10-CM | POA: Diagnosis not present

## 2018-12-14 DIAGNOSIS — R600 Localized edema: Secondary | ICD-10-CM | POA: Diagnosis not present

## 2018-12-14 DIAGNOSIS — Z23 Encounter for immunization: Secondary | ICD-10-CM | POA: Diagnosis not present

## 2018-12-14 DIAGNOSIS — E042 Nontoxic multinodular goiter: Secondary | ICD-10-CM | POA: Diagnosis not present

## 2018-12-21 DIAGNOSIS — R6889 Other general symptoms and signs: Secondary | ICD-10-CM | POA: Diagnosis not present

## 2018-12-21 DIAGNOSIS — Z20828 Contact with and (suspected) exposure to other viral communicable diseases: Secondary | ICD-10-CM | POA: Diagnosis not present

## 2018-12-21 DIAGNOSIS — J029 Acute pharyngitis, unspecified: Secondary | ICD-10-CM | POA: Diagnosis not present

## 2018-12-26 ENCOUNTER — Inpatient Hospital Stay
Admission: AD | Admit: 2018-12-26 | Payer: Medicare Other | Source: Other Acute Inpatient Hospital | Admitting: Internal Medicine

## 2018-12-26 DIAGNOSIS — R0902 Hypoxemia: Secondary | ICD-10-CM | POA: Diagnosis not present

## 2018-12-26 DIAGNOSIS — R0602 Shortness of breath: Secondary | ICD-10-CM | POA: Diagnosis not present

## 2018-12-26 DIAGNOSIS — U071 COVID-19: Secondary | ICD-10-CM | POA: Diagnosis not present

## 2018-12-26 DIAGNOSIS — I4891 Unspecified atrial fibrillation: Secondary | ICD-10-CM | POA: Diagnosis not present

## 2018-12-26 DIAGNOSIS — R0989 Other specified symptoms and signs involving the circulatory and respiratory systems: Secondary | ICD-10-CM | POA: Diagnosis not present

## 2018-12-27 DIAGNOSIS — Z9911 Dependence on respirator [ventilator] status: Secondary | ICD-10-CM | POA: Diagnosis not present

## 2018-12-27 DIAGNOSIS — J80 Acute respiratory distress syndrome: Secondary | ICD-10-CM | POA: Diagnosis present

## 2018-12-27 DIAGNOSIS — B9620 Unspecified Escherichia coli [E. coli] as the cause of diseases classified elsewhere: Secondary | ICD-10-CM | POA: Diagnosis not present

## 2018-12-27 DIAGNOSIS — N390 Urinary tract infection, site not specified: Secondary | ICD-10-CM | POA: Diagnosis present

## 2018-12-27 DIAGNOSIS — R339 Retention of urine, unspecified: Secondary | ICD-10-CM | POA: Diagnosis not present

## 2018-12-27 DIAGNOSIS — R579 Shock, unspecified: Secondary | ICD-10-CM | POA: Diagnosis not present

## 2018-12-27 DIAGNOSIS — D6859 Other primary thrombophilia: Secondary | ICD-10-CM | POA: Diagnosis not present

## 2018-12-27 DIAGNOSIS — I824Z2 Acute embolism and thrombosis of unspecified deep veins of left distal lower extremity: Secondary | ICD-10-CM | POA: Diagnosis not present

## 2018-12-27 DIAGNOSIS — J1289 Other viral pneumonia: Secondary | ICD-10-CM | POA: Diagnosis not present

## 2018-12-27 DIAGNOSIS — E785 Hyperlipidemia, unspecified: Secondary | ICD-10-CM | POA: Diagnosis present

## 2018-12-27 DIAGNOSIS — I21A1 Myocardial infarction type 2: Secondary | ICD-10-CM | POA: Diagnosis present

## 2018-12-27 DIAGNOSIS — J9601 Acute respiratory failure with hypoxia: Secondary | ICD-10-CM | POA: Diagnosis not present

## 2018-12-27 DIAGNOSIS — R410 Disorientation, unspecified: Secondary | ICD-10-CM | POA: Diagnosis not present

## 2018-12-27 DIAGNOSIS — G92 Toxic encephalopathy: Secondary | ICD-10-CM | POA: Diagnosis not present

## 2018-12-27 DIAGNOSIS — A4189 Other specified sepsis: Secondary | ICD-10-CM | POA: Diagnosis present

## 2018-12-27 DIAGNOSIS — N179 Acute kidney failure, unspecified: Secondary | ICD-10-CM | POA: Diagnosis not present

## 2018-12-27 DIAGNOSIS — Z66 Do not resuscitate: Secondary | ICD-10-CM | POA: Diagnosis not present

## 2018-12-27 DIAGNOSIS — I82812 Embolism and thrombosis of superficial veins of left lower extremities: Secondary | ICD-10-CM | POA: Diagnosis not present

## 2018-12-27 DIAGNOSIS — R571 Hypovolemic shock: Secondary | ICD-10-CM | POA: Diagnosis not present

## 2018-12-27 DIAGNOSIS — T83511A Infection and inflammatory reaction due to indwelling urethral catheter, initial encounter: Secondary | ICD-10-CM | POA: Diagnosis present

## 2018-12-27 DIAGNOSIS — J1282 Pneumonia due to coronavirus disease 2019: Secondary | ICD-10-CM | POA: Diagnosis present

## 2018-12-27 DIAGNOSIS — I4891 Unspecified atrial fibrillation: Secondary | ICD-10-CM | POA: Diagnosis present

## 2018-12-27 DIAGNOSIS — I824Y1 Acute embolism and thrombosis of unspecified deep veins of right proximal lower extremity: Secondary | ICD-10-CM | POA: Diagnosis not present

## 2018-12-27 DIAGNOSIS — U071 COVID-19: Secondary | ICD-10-CM | POA: Diagnosis present

## 2018-12-27 DIAGNOSIS — R578 Other shock: Secondary | ICD-10-CM | POA: Diagnosis not present

## 2018-12-27 DIAGNOSIS — R509 Fever, unspecified: Secondary | ICD-10-CM | POA: Diagnosis not present

## 2018-12-27 DIAGNOSIS — D62 Acute posthemorrhagic anemia: Secondary | ICD-10-CM | POA: Diagnosis not present

## 2018-12-27 DIAGNOSIS — J189 Pneumonia, unspecified organism: Secondary | ICD-10-CM | POA: Diagnosis not present

## 2018-12-27 DIAGNOSIS — A419 Sepsis, unspecified organism: Secondary | ICD-10-CM | POA: Diagnosis not present

## 2018-12-27 DIAGNOSIS — J95851 Ventilator associated pneumonia: Secondary | ICD-10-CM | POA: Diagnosis not present

## 2018-12-27 DIAGNOSIS — D689 Coagulation defect, unspecified: Secondary | ICD-10-CM | POA: Diagnosis not present

## 2018-12-27 DIAGNOSIS — J449 Chronic obstructive pulmonary disease, unspecified: Secondary | ICD-10-CM | POA: Diagnosis present

## 2018-12-27 DIAGNOSIS — J156 Pneumonia due to other aerobic Gram-negative bacteria: Secondary | ICD-10-CM | POA: Diagnosis not present

## 2018-12-27 DIAGNOSIS — I10 Essential (primary) hypertension: Secondary | ICD-10-CM | POA: Diagnosis present

## 2018-12-27 DIAGNOSIS — J155 Pneumonia due to Escherichia coli: Secondary | ICD-10-CM | POA: Diagnosis present

## 2018-12-27 DIAGNOSIS — K567 Ileus, unspecified: Secondary | ICD-10-CM | POA: Diagnosis not present

## 2018-12-27 DIAGNOSIS — Z8616 Personal history of COVID-19: Secondary | ICD-10-CM | POA: Diagnosis not present

## 2018-12-27 DIAGNOSIS — Z515 Encounter for palliative care: Secondary | ICD-10-CM | POA: Diagnosis not present

## 2018-12-27 DIAGNOSIS — R6521 Severe sepsis with septic shock: Secondary | ICD-10-CM | POA: Diagnosis present

## 2018-12-27 DIAGNOSIS — S20211A Contusion of right front wall of thorax, initial encounter: Secondary | ICD-10-CM | POA: Diagnosis not present

## 2019-02-12 DEATH — deceased
